# Patient Record
Sex: Female | Born: 1935 | Race: White | Hispanic: No | Smoking: Never smoker
Health system: Southern US, Community
[De-identification: ages and names within clinical notes are randomized; demographics above are authoritative.]

## PROBLEM LIST (undated history)

## (undated) DIAGNOSIS — E079 Disorder of thyroid, unspecified: Secondary | ICD-10-CM

## (undated) DIAGNOSIS — I10 Essential (primary) hypertension: Secondary | ICD-10-CM

## (undated) DIAGNOSIS — I4891 Unspecified atrial fibrillation: Secondary | ICD-10-CM

## (undated) DIAGNOSIS — R251 Tremor, unspecified: Secondary | ICD-10-CM

## (undated) DIAGNOSIS — C801 Malignant (primary) neoplasm, unspecified: Secondary | ICD-10-CM

## (undated) HISTORY — PX: CARDIAC ELECTROPHYSIOLOGY STUDY AND ABLATION: SHX1294

## (undated) HISTORY — PX: BREAST CYST ASPIRATION: SHX578

## (undated) HISTORY — PX: PACEMAKER INSERTION: SHX728

## (undated) HISTORY — PX: ABDOMINAL HYSTERECTOMY: SHX81

## (undated) HISTORY — PX: TONSILLECTOMY: SUR1361

---

## 2006-08-12 ENCOUNTER — Ambulatory Visit: Payer: Self-pay | Admitting: Unknown Physician Specialty

## 2006-08-14 ENCOUNTER — Ambulatory Visit: Payer: Self-pay | Admitting: Unknown Physician Specialty

## 2007-03-03 ENCOUNTER — Ambulatory Visit: Payer: Self-pay | Admitting: Unknown Physician Specialty

## 2007-08-17 ENCOUNTER — Ambulatory Visit: Payer: Self-pay | Admitting: Unknown Physician Specialty

## 2008-07-27 ENCOUNTER — Inpatient Hospital Stay: Payer: Self-pay | Admitting: Internal Medicine

## 2008-12-12 ENCOUNTER — Ambulatory Visit: Payer: Self-pay | Admitting: Unknown Physician Specialty

## 2009-02-22 ENCOUNTER — Ambulatory Visit: Payer: Self-pay | Admitting: Unknown Physician Specialty

## 2009-02-26 ENCOUNTER — Ambulatory Visit: Payer: Self-pay | Admitting: Internal Medicine

## 2010-03-08 ENCOUNTER — Ambulatory Visit: Payer: Self-pay | Admitting: Unknown Physician Specialty

## 2011-05-07 ENCOUNTER — Ambulatory Visit: Payer: Self-pay | Admitting: Unknown Physician Specialty

## 2011-05-17 ENCOUNTER — Ambulatory Visit: Payer: Self-pay

## 2012-03-26 ENCOUNTER — Ambulatory Visit: Payer: Self-pay | Admitting: Medical

## 2013-01-13 ENCOUNTER — Ambulatory Visit: Payer: Self-pay | Admitting: Unknown Physician Specialty

## 2013-04-11 ENCOUNTER — Emergency Department: Payer: Self-pay | Admitting: Emergency Medicine

## 2014-05-10 ENCOUNTER — Ambulatory Visit: Payer: Self-pay | Admitting: Unknown Physician Specialty

## 2014-12-22 ENCOUNTER — Encounter: Payer: Self-pay | Admitting: Emergency Medicine

## 2014-12-22 ENCOUNTER — Emergency Department
Admission: EM | Admit: 2014-12-22 | Discharge: 2014-12-22 | Disposition: A | Discharge status: Home / Self Care | Payer: Medicare Other | Attending: Emergency Medicine | Admitting: Emergency Medicine

## 2014-12-22 ENCOUNTER — Emergency Department: Payer: Medicare Other

## 2014-12-22 DIAGNOSIS — Z880 Allergy status to penicillin: Secondary | ICD-10-CM | POA: Insufficient documentation

## 2014-12-22 DIAGNOSIS — Y998 Other external cause status: Secondary | ICD-10-CM | POA: Diagnosis not present

## 2014-12-22 DIAGNOSIS — Y9289 Other specified places as the place of occurrence of the external cause: Secondary | ICD-10-CM | POA: Insufficient documentation

## 2014-12-22 DIAGNOSIS — S3992XA Unspecified injury of lower back, initial encounter: Secondary | ICD-10-CM | POA: Diagnosis not present

## 2014-12-22 DIAGNOSIS — Y9389 Activity, other specified: Secondary | ICD-10-CM | POA: Insufficient documentation

## 2014-12-22 DIAGNOSIS — S93402A Sprain of unspecified ligament of left ankle, initial encounter: Secondary | ICD-10-CM | POA: Diagnosis not present

## 2014-12-22 DIAGNOSIS — S92252A Displaced fracture of navicular [scaphoid] of left foot, initial encounter for closed fracture: Secondary | ICD-10-CM | POA: Diagnosis not present

## 2014-12-22 DIAGNOSIS — S99912A Unspecified injury of left ankle, initial encounter: Secondary | ICD-10-CM | POA: Diagnosis present

## 2014-12-22 HISTORY — DX: Unspecified atrial fibrillation: I48.91

## 2014-12-22 HISTORY — DX: Tremor, unspecified: R25.1

## 2014-12-22 MED ORDER — TRAMADOL HCL 50 MG PO TABS
50.0000 mg | ORAL_TABLET | Freq: Once | ORAL | Status: AC
  Administered 2014-12-22: 50 mg via ORAL

## 2014-12-22 MED ORDER — TRAMADOL HCL 50 MG PO TABS: 50.0000 mg | ORAL_TABLET | Freq: Four times a day (QID) | ORAL | Status: DC | PRN

## 2014-12-22 MED ORDER — TRAMADOL HCL 50 MG PO TABS
ORAL_TABLET | ORAL | Status: AC
  Filled 2014-12-22: qty 1

## 2014-12-22 NOTE — ED Provider Notes (Signed)
[facility] Emergency Department Provider Note ____________________________________________  Time seen: Approximately 11:23 PM  I have reviewed the triage vital signs and the nursing notes.   HISTORY  Chief Complaint Ankle Pain   HPI Megan Rodgers is a 81 y.o. female who presents to the emergency department with complaint of left foot, ankle, and lower back pain. She states she stepped out of her truck and rolled her ankle and "fell straight down on her (my) butt." She denies loss of consciousness or neck pain.   Past Medical History  Diagnosis Date  . Atrial fibrillation   . Tremors of nervous system     There are no active problems to display for this patient.   Past Surgical History  Procedure Laterality Date  . Pacemaker insertion    . Cardiac electrophysiology study and ablation    . Abdominal hysterectomy    . Tonsillectomy      Current Outpatient Rx  Name  Route  Sig  Dispense  Refill  . traMADol (ULTRAM) 50 MG tablet   Oral   Take 1 tablet (50 mg total) by mouth every 6 (six) hours as needed.   9 tablet   0     Allergies Penicillins  No family history on file.  Social History History  Substance Use Topics  . Smoking status: Never Smoker   . Smokeless tobacco: Not on file  . Alcohol Use: No    Review of Systems Constitutional: No recent illness. Eyes: No visual changes. ENT: No sore throat. Cardiovascular: Denies chest pain or palpitations. Respiratory: Denies shortness of breath. Gastrointestinal: No abdominal pain.  Genitourinary: Negative for dysuria. Musculoskeletal: Pain in left ankle, foot, and lumbar area. Skin: Negative for rash. Neurological: Negative for headaches, focal weakness or numbness. 10-point ROS otherwise negative.  ____________________________________________   PHYSICAL EXAM:  VITAL SIGNS: ED Triage Vitals  Enc Vitals Group     BP 12/22/14 1919 152/61 mmHg     Pulse Rate 12/22/14 1919  76     Resp 12/22/14 1919 18     Temp 12/22/14 1919 98.8 F (37.1 C)     Temp Source 12/22/14 1919 Oral     SpO2 12/22/14 1919 100 %     Weight 12/22/14 1919 136 lb (61.689 kg)     Height 12/22/14 1919 5\' 5"  (1.651 m)     Head Cir --      Peak Flow --      Pain Score 12/22/14 1927 7     Pain Loc --      Pain Edu? --      Excl. in [place]? --     Constitutional: Alert and oriented. Well appearing and in no acute distress. Eyes: Conjunctivae are normal. EOMI. Head: Atraumatic. Nose: No congestion/rhinnorhea. Neck: No stridor.  Respiratory: Normal respiratory effort.   Musculoskeletal: Edema and tenderness to the ankle and dorsal aspect of the left foot. No midline tenderness of the lumbar spine.  Neurologic:  Normal speech and language. No gross focal neurologic deficits are appreciated. Speech is normal. No gait instability. Skin:  Skin is warm, dry and intact. Atraumatic. Psychiatric: Mood and affect are normal. Speech and behavior are normal.  ____________________________________________   LABS (all labs ordered are listed, but only abnormal results are displayed)  Labs Reviewed - No data to display ____________________________________________  RADIOLOGY  Old compression fracture of the L1. Tarsal navicular bone avulsion left. ____________________________________________   PROCEDURES  Procedure(s) performed: Ace bandage applied to the left  foot and ankle. Post Op shoe applied as well. Neurovascularly intact post application.    ____________________________________________   INITIAL IMPRESSION / ASSESSMENT AND PLAN / ED COURSE  Pertinent labs & imaging results that were available during my care of the patient were reviewed by me and considered in my medical decision making (see chart for details).  Patient to use the walker to assist with ambulation. She is to call and schedule an appointment with the orthopedic doctor.  She was advised to return to the ER for  symptoms that change or worsen if she is unable to schedule an appointment. ____________________________________________   FINAL CLINICAL IMPRESSION(S) / ED DIAGNOSES  Final diagnoses:  Navicular fracture of ankle, left, closed, initial encounter  Ankle sprain, left, initial encounter      Victorino Dike, FNP 12/22/14 0413  Hinda Kehr, MD 12/23/14 2108

## 2014-12-22 NOTE — ED Notes (Signed)
NP CBT at bedside at this time

## 2014-12-22 NOTE — Discharge Instructions (Signed)

## 2014-12-22 NOTE — ED Notes (Signed)
Pt discharged home after verbalizing understanding of discharge instructions; nad noted. 

## 2014-12-22 NOTE — ED Notes (Signed)
Patient missed a step, rolled her ankle and landed on her rear. C/o pain to her left ankle. Edema to left ankle noted.

## 2015-03-11 ENCOUNTER — Ambulatory Visit: Payer: Medicare Other

## 2015-03-11 ENCOUNTER — Ambulatory Visit
Admission: EM | Admit: 2015-03-11 | Discharge: 2015-03-11 | Disposition: A | Discharge status: Home / Self Care | Payer: Medicare Other | Attending: Family Medicine | Admitting: Family Medicine

## 2015-03-11 ENCOUNTER — Encounter: Payer: Self-pay | Admitting: *Deleted

## 2015-03-11 DIAGNOSIS — S32019A Unspecified fracture of first lumbar vertebra, initial encounter for closed fracture: Secondary | ICD-10-CM | POA: Insufficient documentation

## 2015-03-11 DIAGNOSIS — I4891 Unspecified atrial fibrillation: Secondary | ICD-10-CM | POA: Insufficient documentation

## 2015-03-11 DIAGNOSIS — E079 Disorder of thyroid, unspecified: Secondary | ICD-10-CM | POA: Insufficient documentation

## 2015-03-11 DIAGNOSIS — S32010A Wedge compression fracture of first lumbar vertebra, initial encounter for closed fracture: Secondary | ICD-10-CM | POA: Diagnosis not present

## 2015-03-11 DIAGNOSIS — W19XXXA Unspecified fall, initial encounter: Secondary | ICD-10-CM | POA: Diagnosis not present

## 2015-03-11 DIAGNOSIS — S51812A Laceration without foreign body of left forearm, initial encounter: Secondary | ICD-10-CM | POA: Insufficient documentation

## 2015-03-11 DIAGNOSIS — Z79899 Other long term (current) drug therapy: Secondary | ICD-10-CM | POA: Insufficient documentation

## 2015-03-11 HISTORY — DX: Disorder of thyroid, unspecified: E07.9

## 2015-03-11 NOTE — Discharge Instructions (Signed)
Back, Compression Fracture °A compression fracture happens when a force is put upon the length of your spine. Slipping and falling on your bottom are examples of such a force. When this happens, sometimes the force is great enough to compress the building blocks (vertebral bodies) of your spine. Although this causes a lot of pain, this can usually be treated at home, unless your caregiver feels hospitalization is needed for pain control. °Your backbone (spinal column) is made up of 24 main vertebral bodies in addition to the sacrum and coccyx (see illustration). These are held together by tough fibrous tissues (ligaments) and by support of your muscles. Nerve roots pass through the openings between the vertebrae. A sudden wrenching move, injury, or a fall may cause a compression fracture of one of the vertebral bodies. This may result in back pain or spread of pain into the belly (abdomen), the buttocks, and down the leg into the foot. Pain may also be created by muscle spasm alone. °Large studies have been undertaken to determine the best possible course of action to help your back following injury and also to prevent future problems. The recommendations are as follows. °FOLLOWING A COMPRESSION FRACTURE: °Do the following only if advised by your caregiver.  °· If a back brace has been suggested or provided, wear it as directed. °· Do not stop wearing the back brace unless instructed by your caregiver. °· When allowed to return to regular activities, avoid a sedentary lifestyle. Actively exercise. Sporadic weekend binges of tennis, racquetball, or waterskiing may actually aggravate or create problems, especially if you are not in condition for that activity. °· Avoid sports requiring sudden body movements until you are in condition for them. Swimming and walking are safer activities. °· Maintain good posture. °· Avoid obesity. °· If not already done, you should have a DEXA scan. Based on the results, be treated for  osteoporosis. °FOLLOWING ACUTE (SUDDEN) INJURY: °· Only take over-the-counter or prescription medicines for pain, discomfort, or fever as directed by your caregiver. °· Use bed rest for only the most extreme acute episode. Prolonged bed rest may aggravate your condition. Ice used for acute conditions is effective. Use a large plastic bag filled with ice. Wrap it in a towel. This also provides excellent pain relief. This may be continuous. Or use it for 30 minutes every 2 hours during acute phase, then as needed. Heat for 30 minutes prior to activities is helpful. °· As soon as the acute phase (the time when your back is too painful for you to do normal activities) is over, it is important to resume normal activities and work hardening programs. Back injuries can cause potentially marked changes in lifestyle. So it is important to attack these problems aggressively. °· See your caregiver for continued problems. He or she can help or refer you for appropriate exercises, physical therapy, and work hardening if needed. °· If you are given narcotic medications for your condition, for the next 24 hours do not: °¨ Drive. °¨ Operate machinery or power tools. °¨ Sign legal documents. °· Do not drink alcohol, or take sleeping pills or other medications that may interfere with treatment. °If your caregiver has given you a follow-up appointment, it is very important to keep that appointment. Not keeping the appointment could result in a chronic or permanent injury, pain, and disability. If there is any problem keeping the appointment, you must call back to this facility for assistance.  °SEEK IMMEDIATE MEDICAL CARE IF: °· You develop numbness,   tingling, weakness, or problems with the use of your arms or legs. °· You develop severe back pain not relieved with medications. °· You have changes in bowel or bladder control. °· You have increasing pain in any areas of the body. °Document Released: 06/10/2005 Document Revised:  10/25/2013 Document Reviewed: 01/13/2008 °Exit[employer]® Patient Information ©2015 [employer]. This information is not intended to replace advice given to you by your health care provider. Make sure you discuss any questions you have with your health care provider. ° °

## 2015-03-11 NOTE — ED Provider Notes (Signed)
CSN: [id number]     Arrival date & time 03/11/15  1031 History   First MD Initiated Contact with Patient 03/11/15 1144     Chief Complaint  Patient presents with  . Fall   (Consider location/radiation/quality/duration/timing/severity/associated sxs/prior Treatment) HPI  This a 81 year old female who presents after falling at about 3 AM this morning. She states that she had increased her dose of primidone twice at nighttime cause of sleepiness she was feeling during the daytime. She states that she awoke last night to go the bathroom and after finishing urinating walk back into her room a very short distance, was in the process of reaching up to turn off her fan,she evidently fell hard on her left side and being out momentarily but she is uncertain as this was unwitnessed and nobody came to her aid. She found that she had a laceration on her left dorsal forearm that had bled while she was in bed. Doesn't think that she hit her head. Most her pain is in her lef tback and her left arm. Past Medical History  Diagnosis Date  . Atrial fibrillation   . Tremors of nervous system   . Thyroid disease    Past Surgical History  Procedure Laterality Date  . Pacemaker insertion    . Cardiac electrophysiology study and ablation    . Abdominal hysterectomy    . Tonsillectomy     No family history on file. Social History  Substance Use Topics  . Smoking status: Never Smoker   . Smokeless tobacco: None  . Alcohol Use: No   OB History    No data available     Review of Systems  Cardiovascular: Negative for chest pain, palpitations and leg swelling.  Musculoskeletal: Positive for back pain.  Skin: Positive for wound.  All other systems reviewed and are negative.   Allergies  Penicillins; Codeine; Iodinated diagnostic agents; Metronidazole; Shellfish-derived products; and Tetracyclines & related  Home Medications   Prior to Admission medications   Medication Sig Start Date End Date Taking?  Authorizing Provider  Cholecalciferol (VITAMIN D-3 PO) Take by mouth.   Yes Historical Provider, MD  citalopram (CELEXA) 10 MG tablet Take 10 mg by mouth daily.   Yes Historical Provider, MD  dabigatran (PRADAXA) 150 MG CAPS capsule Take 150 mg by mouth 2 (two) times daily.   Yes Historical Provider, MD  estradiol (ESTRACE) 0.1 MG/GM vaginal cream Place 1 Applicatorful vaginally at bedtime.   Yes Historical Provider, MD  fluticasone (FLONASE) 50 MCG/ACT nasal spray Place into both nostrils daily.   Yes Historical Provider, MD  levothyroxine (SYNTHROID, LEVOTHROID) 50 MCG tablet Take 50 mcg by mouth daily before breakfast.   Yes Historical Provider, MD  Multiple Vitamin (MULTIVITAMIN) capsule Take 1 capsule by mouth daily.   Yes Historical Provider, MD  primidone (MYSOLINE) 50 MG tablet Take 50 mg by mouth 3 (three) times daily.   Yes Historical Provider, MD  propranolol (INDERAL) 60 MG tablet Take 60 mg by mouth daily.   Yes Historical Provider, MD  traMADol (ULTRAM) 50 MG tablet Take 1 tablet (50 mg total) by mouth every 6 (six) hours as needed. 12/22/14   Victorino Dike, FNP   Meds Ordered and Administered this Visit  Medications - No data to display  BP 126/59 mmHg  Pulse 74  Temp(Src) 98.3 F (36.8 C) (Oral)  Ht 5\' 5"  (1.651 m)  Wt 134 lb (60.782 kg)  BMI 22.30 kg/m2  SpO2 100% No data found.  Physical Exam  Constitutional: She is oriented to person, place, and time. She appears well-developed and well-nourished. No distress.  HENT:  Head: Normocephalic and atraumatic.  Eyes: EOM are normal. Pupils are equal, round, and reactive to light.  Neck: Normal range of motion. Neck supple.  Pulmonary/Chest: Effort normal and breath sounds normal. No stridor. No respiratory distress. She has no wheezes. She has no rales.  Abdominal: Soft. Bowel sounds are normal. She exhibits no distension. There is no tenderness. There is no rebound and no guarding.  Musculoskeletal: Normal range of  motion.  Examination the spine was carried out with Gwinda Passe RN acting as chaperone. The spine shows no deformities. She is able to stand with level pelvis. For flexion causes slight back pain lower thoracic upper lumbar and maximum tenderness is along the paraspinous muscles at that level almost entirely on the left. There does not appear to be any tenderness in the paraspinous muscles on the right. Thoracic rotation is most uncomfortable again to the left at the thoracolumbar junction.  Lymphadenopathy:    She has no cervical adenopathy.  Neurological: She is alert and oriented to person, place, and time. She displays normal reflexes. No cranial nerve deficit. She exhibits normal muscle tone. Coordination normal.  Skin: Skin is warm and dry. She is not diaphoretic.  Psychiatric: She has a normal mood and affect. Her behavior is normal. Judgment and thought content normal.  Nursing note and vitals reviewed.   ED Course  Procedures (including critical care time)  Labs Review Labs Reviewed - No data to display  Imaging Review Dg Chest 2 View  03/11/2015   CLINICAL DATA:  Left paraspinal pain and muscle spasms, status post fall.  EXAM: CHEST  2 VIEW, THORACIC SPINE TWO VIEW  COMPARISON:  Chest radiograph 05/17/2011  FINDINGS: Dual lead cardiac pacemaker is stable.  Cardiomediastinal silhouette is normal. Mediastinal contours appear intact. The aorta is torturous.  There is no evidence of focal airspace consolidation, pleural effusion or pneumothorax. There stable hyperinflation of the lungs. There is a small left pleural effusion.  Osseous structures are without acute abnormality. There is a stable compression deformity of the superior endplate of L2 vertebral body, with approximately 40% height loss, stable from the previous chest radiograph dated November 2012.  There is dextro convex thoraco lumbar spine scoliosis centered at T12. Mild compression deformity of T12 and L1 vertebral bodies is likely  degenerative. There are multilevel osteoarthritic changes of the thoracic spine.  Soft tissues are grossly normal.  IMPRESSION: No radiographic evidence of acute cardiopulmonary abnormality.  Stable hyperinflation of the lungs likely related to obstructive disease.  Small left pleural effusion.  L2 vertebral body compression deformity, new from the prior radiograph dated 2012. Please correlate to site of tenderness, as acute fracture cannot be excluded.  Osteoarthritic changes of the thoracolumbar spine and stable mild compression deformities of T12 and L1 vertebral bodies.   Electronically Signed   By: Fidela Salisbury M.D.   On: 03/11/2015 13:24   Dg Thoracic Spine 2 View  03/11/2015   CLINICAL DATA:  Left paraspinal pain and muscle spasms, status post fall.  EXAM: CHEST  2 VIEW, THORACIC SPINE TWO VIEW  COMPARISON:  Chest radiograph 05/17/2011  FINDINGS: Dual lead cardiac pacemaker is stable.  Cardiomediastinal silhouette is normal. Mediastinal contours appear intact. The aorta is torturous.  There is no evidence of focal airspace consolidation, pleural effusion or pneumothorax. There stable hyperinflation of the lungs. There is a small left pleural effusion.  Osseous structures are without acute abnormality. There is a stable compression deformity of the superior endplate of L2 vertebral body, with approximately 40% height loss, stable from the previous chest radiograph dated November 2012.  There is dextro convex thoraco lumbar spine scoliosis centered at T12. Mild compression deformity of T12 and L1 vertebral bodies is likely degenerative. There are multilevel osteoarthritic changes of the thoracic spine.  Soft tissues are grossly normal.  IMPRESSION: No radiographic evidence of acute cardiopulmonary abnormality.  Stable hyperinflation of the lungs likely related to obstructive disease.  Small left pleural effusion.  L2 vertebral body compression deformity, new from the prior radiograph dated 2012.  Please correlate to site of tenderness, as acute fracture cannot be excluded.  Osteoarthritic changes of the thoracolumbar spine and stable mild compression deformities of T12 and L1 vertebral bodies.   Electronically Signed   By: Fidela Salisbury M.D.   On: 03/11/2015 13:24     Visual Acuity Review  Right Eye Distance:   Left Eye Distance:   Bilateral Distance:    Right Eye Near:   Left Eye Near:    Bilateral Near:         MDM   1. Compression fracture of L1 lumbar vertebra, closed, initial encounter   2. Skin tear of forearm without complication, left, initial encounter    New Prescriptions   No medications on file  Plan: 1. Test/x-ray results and diagnosis reviewed with patient 2. rx as per orders; risks, benefits, potential side effects reviewed with patient 3. Recommend supportive treatment with ace wrap to left forearm.  Avoid bending at waist. 4. F/u PCP next week in 2 days. To ED if confusion ,increased pain constipation etc. Use Tylenol for pain.     Lorin Picket, PA-C 03/11/15 1409

## 2015-03-11 NOTE — ED Notes (Signed)
Pt states that she had a fall about 3am today, reached up to turn light switch on and the next thing she knew she was on the floor.  Pt states that she was not dizzy.  In the fall she now has mid-back pain and a laceration on left arm.

## 2015-03-14 ENCOUNTER — Ambulatory Visit: Payer: Medicare Other

## 2015-03-14 ENCOUNTER — Ambulatory Visit
Admission: EM | Admit: 2015-03-14 | Discharge: 2015-03-14 | Disposition: A | Discharge status: Home / Self Care | Payer: Medicare Other | Attending: Family Medicine | Admitting: Family Medicine

## 2015-03-14 DIAGNOSIS — X58XXXS Exposure to other specified factors, sequela: Secondary | ICD-10-CM | POA: Diagnosis not present

## 2015-03-14 DIAGNOSIS — I4891 Unspecified atrial fibrillation: Secondary | ICD-10-CM | POA: Insufficient documentation

## 2015-03-14 DIAGNOSIS — E079 Disorder of thyroid, unspecified: Secondary | ICD-10-CM | POA: Diagnosis not present

## 2015-03-14 DIAGNOSIS — S29019A Strain of muscle and tendon of unspecified wall of thorax, initial encounter: Secondary | ICD-10-CM

## 2015-03-14 DIAGNOSIS — M4856XG Collapsed vertebra, not elsewhere classified, lumbar region, subsequent encounter for fracture with delayed healing: Secondary | ICD-10-CM | POA: Insufficient documentation

## 2015-03-14 DIAGNOSIS — S29009A Unspecified injury of muscle and tendon of unspecified wall of thorax, initial encounter: Secondary | ICD-10-CM | POA: Diagnosis not present

## 2015-03-14 DIAGNOSIS — S51802D Unspecified open wound of left forearm, subsequent encounter: Secondary | ICD-10-CM

## 2015-03-14 DIAGNOSIS — Z79899 Other long term (current) drug therapy: Secondary | ICD-10-CM | POA: Diagnosis not present

## 2015-03-14 DIAGNOSIS — S32020S Wedge compression fracture of second lumbar vertebra, sequela: Secondary | ICD-10-CM | POA: Diagnosis not present

## 2015-03-14 DIAGNOSIS — S51812D Laceration without foreign body of left forearm, subsequent encounter: Secondary | ICD-10-CM

## 2015-03-14 NOTE — ED Provider Notes (Signed)
CSN: [id number]     Arrival date & time 03/14/15  1126 History   First MD Initiated Contact with Patient 03/14/15 1302     Chief Complaint  Patient presents with  . Follow-up   (Consider location/radiation/quality/duration/timing/severity/associated sxs/prior Treatment) HPI   This is a 81 year old female who was seen by the undersigned last week after she fell from an apparent syncopal episode caused by medication changes that she had changed recently. At that time she had sustained a compression fracture of the L2 vertebral body ,skin tear on her dorsum left arm was not complaining of shoulder pain at that time. I asked her to follow-up with her primary care physician this week but she states her son is having this back problems  and was unable to take her and she [place] afraid [place] , so she returned here. Her major complaint today is scapular pain. She is not having any cervical pain and has full range of motion. She perceives her pain slightly under her arm as well. Denies any shortness of breath or productive cough.    Past Medical History  Diagnosis Date  . Atrial fibrillation   . Tremors of nervous system   . Thyroid disease    Past Surgical History  Procedure Laterality Date  . Pacemaker insertion    . Cardiac electrophysiology study and ablation    . Abdominal hysterectomy    . Tonsillectomy     No family history on file. Social History  Substance Use Topics  . Smoking status: Never Smoker   . Smokeless tobacco: None  . Alcohol Use: No   OB History    No data available     Review of Systems  Constitutional: Positive for activity change. Negative for fever, chills and fatigue.  Musculoskeletal: Positive for myalgias.  All other systems reviewed and are negative.   Allergies  Penicillins; Codeine; Iodinated diagnostic agents; Metronidazole; Shellfish-derived products; and Tetracyclines & related  Home Medications   Prior to Admission medications    Medication Sig Start Date End Date Taking? Authorizing Provider  Cholecalciferol (VITAMIN D-3 PO) Take by mouth.    Historical Provider, MD  citalopram (CELEXA) 10 MG tablet Take 10 mg by mouth daily.    Historical Provider, MD  dabigatran (PRADAXA) 150 MG CAPS capsule Take 150 mg by mouth 2 (two) times daily.    Historical Provider, MD  estradiol (ESTRACE) 0.1 MG/GM vaginal cream Place 1 Applicatorful vaginally at bedtime.    Historical Provider, MD  fluticasone (FLONASE) 50 MCG/ACT nasal spray Place into both nostrils daily.    Historical Provider, MD  levothyroxine (SYNTHROID, LEVOTHROID) 50 MCG tablet Take 50 mcg by mouth daily before breakfast.    Historical Provider, MD  Multiple Vitamin (MULTIVITAMIN) capsule Take 1 capsule by mouth daily.    Historical Provider, MD  primidone (MYSOLINE) 50 MG tablet Take 50 mg by mouth 3 (three) times daily.    Historical Provider, MD  propranolol (INDERAL) 60 MG tablet Take 60 mg by mouth daily.    Historical Provider, MD  traMADol (ULTRAM) 50 MG tablet Take 1 tablet (50 mg total) by mouth every 6 (six) hours as needed. 12/22/14   Victorino Dike, FNP   Meds Ordered and Administered this Visit  Medications - No data to display  BP 132/67 mmHg  Pulse 70  Temp(Src) 97.4 F (36.3 C) (Tympanic)  Resp 16  Ht 5\' 5"  (1.651 m)  Wt 134 lb (60.782 kg)  BMI 22.30  kg/m2  SpO2 99% No data found.   Physical Exam  Constitutional: She is oriented to person, place, and time. She appears well-developed and well-nourished. No distress.  HENT:  Head: Normocephalic and atraumatic.  Eyes: Pupils are equal, round, and reactive to light.  Neck: Normal range of motion. Neck supple.  CervicalSpine range of motion shows full flexion and extension and is equal bilaterally without pain. Does have tenderness to palpation along the medial scapular border at approximately the T3-4 level, reproducing her pain. Shoulder range of motion passively and actively is full and  comfortable. Is no tenderness over the acromion or along the humerus. He has no radius or ulnar tenderness;range of motion of the elbow and wrist are full.  Musculoskeletal: Normal range of motion.  Neurological: She is alert and oriented to person, place, and time.  Skin: Skin is warm and dry. She is not diaphoretic.  Psychiatric: She has a normal mood and affect. Her behavior is normal. Judgment and thought content normal.  Nursing note and vitals reviewed.   ED Course  Procedures (including critical care time)  Labs Review Labs Reviewed - No data to display  Imaging Review Dg Shoulder Left  03/14/2015   CLINICAL DATA:  81 year old female with left arm and shoulder pain after falling this past Saturday morning  EXAM: LEFT SHOULDER - 2+ VIEW  COMPARISON:  Prior chest x-ray 03/11/2015  FINDINGS: There is no evidence of fracture or dislocation. There is no evidence of arthropathy or other focal bone abnormality. Soft tissues are unremarkable. Left subclavian approach cardiac rhythm maintenance device. Atherosclerotic calcifications present in the transverse aorta.  IMPRESSION: Negative.   Electronically Signed   By: Jacqulynn Cadet M.D.   On: 03/14/2015 13:52     Visual Acuity Review  Right Eye Distance:   Left Eye Distance:   Bilateral Distance:    Right Eye Near:   Left Eye Near:    Bilateral Near:         MDM   1. Compression fracture of L2, sequela   2. Skin tear of left forearm without complication, subsequent encounter   3. Thoracic myofascial strain, initial encounter    Plan: 1. Test/x-ray results and diagnosis reviewed with patient 2. rx as per orders; risks, benefits, potential side effects reviewed with patient 3. Recommend supportive treatment with tylenol,rest.  4. F/u PCP next week   Lorin Picket, PA-C 03/14/15 1428

## 2015-03-14 NOTE — ED Notes (Signed)
Pt states "I am still having pain under my left arm. It is hard for me to get to [facility] to my Primary Care."

## 2016-02-05 ENCOUNTER — Other Ambulatory Visit: Payer: Self-pay | Admitting: Unknown Physician Specialty

## 2016-02-05 DIAGNOSIS — Z1231 Encounter for screening mammogram for malignant neoplasm of breast: Secondary | ICD-10-CM

## 2016-02-07 ENCOUNTER — Ambulatory Visit
Admission: RE | Admit: 2016-02-07 | Discharge: 2016-02-07 | Disposition: A | Discharge status: Home / Self Care | Payer: Medicare Other | Source: Ambulatory Visit | Attending: Unknown Physician Specialty | Admitting: Unknown Physician Specialty

## 2016-02-07 ENCOUNTER — Other Ambulatory Visit: Payer: Self-pay | Admitting: Unknown Physician Specialty

## 2016-02-07 DIAGNOSIS — Z1231 Encounter for screening mammogram for malignant neoplasm of breast: Secondary | ICD-10-CM

## 2016-02-07 HISTORY — DX: Malignant (primary) neoplasm, unspecified: C80.1

## 2017-08-20 ENCOUNTER — Ambulatory Visit (INDEPENDENT_AMBULATORY_CARE_PROVIDER_SITE_OTHER): Payer: Medicare Other | Admitting: Vascular Surgery

## 2017-08-20 ENCOUNTER — Encounter (INDEPENDENT_AMBULATORY_CARE_PROVIDER_SITE_OTHER): Payer: Self-pay | Admitting: Vascular Surgery

## 2017-08-20 ENCOUNTER — Ambulatory Visit (INDEPENDENT_AMBULATORY_CARE_PROVIDER_SITE_OTHER): Payer: Medicare Other

## 2017-08-20 ENCOUNTER — Other Ambulatory Visit (INDEPENDENT_AMBULATORY_CARE_PROVIDER_SITE_OTHER): Payer: Self-pay | Admitting: Vascular Surgery

## 2017-08-20 DIAGNOSIS — I10 Essential (primary) hypertension: Secondary | ICD-10-CM | POA: Diagnosis not present

## 2017-08-20 DIAGNOSIS — I739 Peripheral vascular disease, unspecified: Secondary | ICD-10-CM

## 2017-08-20 DIAGNOSIS — M25579 Pain in unspecified ankle and joints of unspecified foot: Secondary | ICD-10-CM

## 2017-08-20 NOTE — Progress Notes (Signed)
Subjective:    Patient ID: Megan Rodgers, female    DOB: 1929/05/04, 84 y.o.   MRN: [id number] No chief complaint on file.  Presents as a new patient referred by Dr. Vickki Muff for evaluation of bilateral foot pain.  The patient was seen with her daughter.  The patient reports experiencing bilateral foot pain which is now radiating up her legs.  This has been progressively worsening over the last "few months".  The patient states she does experience cramping to the bilateral calves on occasion.  The patient denies any rest pain or ulcerations extremity.  She denies any recent surgery or trauma to the bilateral lower extremity.  The patient denies any erythema to the bilateral lower extremity.  The patient underwent a bilateral ABI which was notable for resting ankle-brachial index ease within normal range bilaterally.  No evidence of significant bilateral lower extremity arterial disease.  There is no previous ABI for comparison.  The patient denies any fever, nausea or vomiting.   Review of Systems  Constitutional: Negative.   HENT: Negative.   Eyes: Negative.   Respiratory: Negative.   Cardiovascular:       Bilateral foot pain  Gastrointestinal: Negative.   Endocrine: Negative.   Genitourinary: Negative.   Musculoskeletal: Negative.   Skin: Negative.   Allergic/Immunologic: Negative.   Neurological: Negative.   Hematological: Negative.   Psychiatric/Behavioral: Negative.       Objective:   Physical Exam  Constitutional: She is oriented to person, place, and time. She appears well-developed and well-nourished. No distress.  HENT:  Head: Normocephalic and atraumatic.  Eyes: Conjunctivae are normal. Pupils are equal, round, and reactive to light.  Neck: Normal range of motion.  Cardiovascular: Normal rate, regular rhythm, normal heart sounds and intact distal pulses.  Pulses:      Radial pulses are 2+ on the right side, and 2+ on the left side.       Dorsalis pedis pulses are 1+  on the right side, and 1+ on the left side.       Posterior tibial pulses are 1+ on the right side, and 1+ on the left side.  Pulmonary/Chest: Effort normal and breath sounds normal.  Musculoskeletal: Normal range of motion. She exhibits no edema.  Neurological: She is alert and oriented to person, place, and time.  Skin: Skin is warm and dry. She is not diaphoretic.  Psychiatric: She has a normal mood and affect. Her behavior is normal. Judgment and thought content normal.  Vitals reviewed.  There were no vitals taken for this visit.  Past Medical History:  Diagnosis Date  . Atrial fibrillation ([place])   . Cancer ([place])    skin ca  . Thyroid disease   . Tremors of nervous system    Social History   Socioeconomic History  . Marital status: Widowed    Spouse name: Not on file  . Number of children: Not on file  . Years of education: Not on file  . Highest education level: Not on file  Social Needs  . Financial resource strain: Not on file  . Food insecurity - worry: Not on file  . Food insecurity - inability: Not on file  . Transportation needs - medical: Not on file  . Transportation needs - non-medical: Not on file  Occupational History  . Not on file  Tobacco Use  . Smoking status: Never Smoker  . Smokeless tobacco: Never Used  Substance and Sexual Activity  . Alcohol use: No  .  Drug use: No  . Sexual activity: Not on file  Other Topics Concern  . Not on file  Social History Narrative  . Not on file   Past Surgical History:  Procedure Laterality Date  . ABDOMINAL HYSTERECTOMY    . BREAST CYST ASPIRATION Left    lt fna-neg  . CARDIAC ELECTROPHYSIOLOGY STUDY AND ABLATION    . PACEMAKER INSERTION    . TONSILLECTOMY     Family History  Problem Relation Age of Onset  . Breast cancer Neg Hx    Allergies  Allergen Reactions  . Penicillins Anaphylaxis  . Codeine   . Iodinated Diagnostic Agents   . Metronidazole   . Shellfish-Derived Products   . Tetracyclines  & Related       Assessment & Plan:  Presents as a new patient referred by Dr. Vickki Muff for evaluation of bilateral foot pain.  The patient was seen with her daughter.  The patient reports experiencing bilateral foot pain which is now radiating up her legs.  This has been progressively worsening over the last "few months".  The patient states she does experience cramping to the bilateral calves on occasion.  The patient denies any rest pain or ulcerations extremity.  She denies any recent surgery or trauma to the bilateral lower extremity.  The patient denies any erythema to the bilateral lower extremity.  The patient underwent a bilateral ABI which was notable for resting ankle-brachial index ease within normal range bilaterally.  No evidence of significant bilateral lower extremity arterial disease.  There is no previous ABI for comparison.  The patient denies any fever, nausea or vomiting.  1. Pain in joint involving ankle and foot, unspecified laterality - New Patient presents with chronic bilateral foot pain Physical exam is unremarkable.  Patient with faint pedal pulses however this is most likely anatomical in nature Patient underwent an ABI today which was normal with no evidence of lower extremity arterial occlusive disease bilaterally Patient's discomfort most likely neuropathic or osteoarthritic in nature. Patient to follow-up as needed  2. Essential hypertension - Stable Encouraged good control as its slows the progression of atherosclerotic disease  Current Outpatient Medications on File Prior to Visit  Medication Sig Dispense Refill  . Cholecalciferol (VITAMIN D-3 PO) Take by mouth.    . citalopram (CELEXA) 10 MG tablet Take 10 mg by mouth daily.    . dabigatran (PRADAXA) 150 MG CAPS capsule Take 150 mg by mouth 2 (two) times daily.    Marland Kitchen estradiol (ESTRACE) 0.1 MG/GM vaginal cream Place 1 Applicatorful vaginally at bedtime.    . hydrochlorothiazide (HYDRODIURIL) 12.5 MG tablet Take  by mouth.    Marland Kitchen LACTOBACILLUS PO Take by mouth.    . levothyroxine (SYNTHROID, LEVOTHROID) 50 MCG tablet Take 75 mcg by mouth daily before breakfast.     . Multiple Vitamin (MULTIVITAMIN) capsule Take 1 capsule by mouth daily.    . naphazoline-glycerin (CLEAR EYES REDNESS) 0.012-0.2 % SOLN Apply to eye.    . primidone (MYSOLINE) 50 MG tablet Take 50 mg by mouth 3 (three) times daily.    . propranolol (INDERAL) 60 MG tablet Take 60 mg by mouth daily.    . fluticasone (FLONASE) 50 MCG/ACT nasal spray Place into both nostrils daily.    . traMADol (ULTRAM) 50 MG tablet Take 1 tablet (50 mg total) by mouth every 6 (six) hours as needed. (Patient not taking: Reported on 08/20/2017) 9 tablet 0   No current facility-administered medications on file prior to visit.  There are no Patient Instructions on file for this visit. No Follow-up on file.  Royce Stegman A Jvon Meroney, PA-C

## 2018-04-07 ENCOUNTER — Emergency Department: Payer: Medicare Other

## 2018-04-07 ENCOUNTER — Encounter: Payer: Self-pay | Admitting: *Deleted

## 2018-04-07 ENCOUNTER — Inpatient Hospital Stay
Admission: EM | Admit: 2018-04-07 | Discharge: 2018-04-10 | DRG: 640 | Disposition: A | Discharge status: Skilled Nursing Facility | Payer: Medicare Other | Attending: Internal Medicine | Admitting: Internal Medicine

## 2018-04-07 ENCOUNTER — Other Ambulatory Visit: Payer: Self-pay

## 2018-04-07 DIAGNOSIS — Z79890 Hormone replacement therapy (postmenopausal): Secondary | ICD-10-CM | POA: Diagnosis not present

## 2018-04-07 DIAGNOSIS — Z23 Encounter for immunization: Secondary | ICD-10-CM

## 2018-04-07 DIAGNOSIS — M79604 Pain in right leg: Secondary | ICD-10-CM

## 2018-04-07 DIAGNOSIS — E43 Unspecified severe protein-calorie malnutrition: Secondary | ICD-10-CM | POA: Diagnosis present

## 2018-04-07 DIAGNOSIS — S7001XA Contusion of right hip, initial encounter: Secondary | ICD-10-CM | POA: Diagnosis present

## 2018-04-07 DIAGNOSIS — E876 Hypokalemia: Secondary | ICD-10-CM | POA: Diagnosis present

## 2018-04-07 DIAGNOSIS — Z885 Allergy status to narcotic agent status: Secondary | ICD-10-CM

## 2018-04-07 DIAGNOSIS — M25572 Pain in left ankle and joints of left foot: Secondary | ICD-10-CM

## 2018-04-07 DIAGNOSIS — Z91041 Radiographic dye allergy status: Secondary | ICD-10-CM

## 2018-04-07 DIAGNOSIS — Z888 Allergy status to other drugs, medicaments and biological substances status: Secondary | ICD-10-CM | POA: Diagnosis not present

## 2018-04-07 DIAGNOSIS — E871 Hypo-osmolality and hyponatremia: Principal | ICD-10-CM

## 2018-04-07 DIAGNOSIS — Z881 Allergy status to other antibiotic agents status: Secondary | ICD-10-CM

## 2018-04-07 DIAGNOSIS — I10 Essential (primary) hypertension: Secondary | ICD-10-CM | POA: Diagnosis present

## 2018-04-07 DIAGNOSIS — M25551 Pain in right hip: Secondary | ICD-10-CM

## 2018-04-07 DIAGNOSIS — Z880 Allergy status to penicillin: Secondary | ICD-10-CM | POA: Diagnosis not present

## 2018-04-07 DIAGNOSIS — W010XXA Fall on same level from slipping, tripping and stumbling without subsequent striking against object, initial encounter: Secondary | ICD-10-CM | POA: Diagnosis present

## 2018-04-07 DIAGNOSIS — Z85828 Personal history of other malignant neoplasm of skin: Secondary | ICD-10-CM

## 2018-04-07 DIAGNOSIS — Z8249 Family history of ischemic heart disease and other diseases of the circulatory system: Secondary | ICD-10-CM

## 2018-04-07 DIAGNOSIS — N3001 Acute cystitis with hematuria: Secondary | ICD-10-CM | POA: Diagnosis present

## 2018-04-07 DIAGNOSIS — Z91013 Allergy to seafood: Secondary | ICD-10-CM

## 2018-04-07 DIAGNOSIS — Z950 Presence of cardiac pacemaker: Secondary | ICD-10-CM | POA: Diagnosis not present

## 2018-04-07 DIAGNOSIS — M25472 Effusion, left ankle: Secondary | ICD-10-CM | POA: Diagnosis present

## 2018-04-07 DIAGNOSIS — Z90710 Acquired absence of both cervix and uterus: Secondary | ICD-10-CM

## 2018-04-07 DIAGNOSIS — M25511 Pain in right shoulder: Secondary | ICD-10-CM

## 2018-04-07 DIAGNOSIS — Z9089 Acquired absence of other organs: Secondary | ICD-10-CM

## 2018-04-07 DIAGNOSIS — R296 Repeated falls: Secondary | ICD-10-CM | POA: Diagnosis present

## 2018-04-07 DIAGNOSIS — Z7901 Long term (current) use of anticoagulants: Secondary | ICD-10-CM

## 2018-04-07 DIAGNOSIS — R52 Pain, unspecified: Secondary | ICD-10-CM

## 2018-04-07 DIAGNOSIS — I482 Chronic atrial fibrillation, unspecified: Secondary | ICD-10-CM | POA: Diagnosis present

## 2018-04-07 DIAGNOSIS — W19XXXA Unspecified fall, initial encounter: Secondary | ICD-10-CM

## 2018-04-07 DIAGNOSIS — E039 Hypothyroidism, unspecified: Secondary | ICD-10-CM | POA: Diagnosis present

## 2018-04-07 DIAGNOSIS — Z66 Do not resuscitate: Secondary | ICD-10-CM | POA: Diagnosis present

## 2018-04-07 DIAGNOSIS — Y92009 Unspecified place in unspecified non-institutional (private) residence as the place of occurrence of the external cause: Secondary | ICD-10-CM

## 2018-04-07 HISTORY — DX: Essential (primary) hypertension: I10

## 2018-04-07 LAB — CBC WITH DIFFERENTIAL/PLATELET
Abs Immature Granulocytes: 0.08 10*3/uL — ABNORMAL HIGH (ref 0.00–0.07)
BASOS ABS: 0 10*3/uL (ref 0.0–0.1)
Basophils Relative: 0 %
EOS PCT: 0 %
Eosinophils Absolute: 0.1 10*3/uL (ref 0.0–0.5)
HEMATOCRIT: 34.3 % — AB (ref 36.0–46.0)
Hemoglobin: 11.6 g/dL — ABNORMAL LOW (ref 12.0–15.0)
Immature Granulocytes: 1 %
LYMPHS PCT: 6 %
Lymphs Abs: 0.9 10*3/uL (ref 0.7–4.0)
MCH: 32.2 pg (ref 26.0–34.0)
MCHC: 33.8 g/dL (ref 30.0–36.0)
MCV: 95.3 fL (ref 80.0–100.0)
MONOS PCT: 8 %
Monocytes Absolute: 1.1 10*3/uL — ABNORMAL HIGH (ref 0.1–1.0)
NRBC: 0 % (ref 0.0–0.2)
Neutro Abs: 12.8 10*3/uL — ABNORMAL HIGH (ref 1.7–7.7)
Neutrophils Relative %: 85 %
Platelets: 144 10*3/uL — ABNORMAL LOW (ref 150–400)
RBC: 3.6 MIL/uL — ABNORMAL LOW (ref 3.87–5.11)
RDW: 12.9 % (ref 11.5–15.5)
WBC: 15 10*3/uL — ABNORMAL HIGH (ref 4.0–10.5)

## 2018-04-07 LAB — BASIC METABOLIC PANEL
Anion gap: 9 (ref 5–15)
BUN: 27 mg/dL — ABNORMAL HIGH (ref 8–23)
CHLORIDE: 93 mmol/L — AB (ref 98–111)
CO2: 22 mmol/L (ref 22–32)
Calcium: 8.4 mg/dL — ABNORMAL LOW (ref 8.9–10.3)
Creatinine, Ser: 0.99 mg/dL (ref 0.44–1.00)
GFR calc non Af Amer: 50 mL/min — ABNORMAL LOW (ref >60)
GFR, EST AFRICAN AMERICAN: 58 mL/min — AB (ref >60)
Glucose, Bld: 126 mg/dL — ABNORMAL HIGH (ref 70–99)
POTASSIUM: 3.4 mmol/L — AB (ref 3.5–5.1)
Sodium: 124 mmol/L — ABNORMAL LOW (ref 135–145)

## 2018-04-07 LAB — URINALYSIS, COMPLETE (UACMP) WITH MICROSCOPIC
Bilirubin Urine: NEGATIVE
Glucose, UA: NEGATIVE mg/dL
Ketones, ur: 20 mg/dL — AB
Nitrite: POSITIVE — AB
Protein, ur: 30 mg/dL — AB
SPECIFIC GRAVITY, URINE: 1.013 (ref 1.005–1.030)
pH: 6 (ref 5.0–8.0)

## 2018-04-07 LAB — MAGNESIUM: Magnesium: 2.1 mg/dL (ref 1.7–2.4)

## 2018-04-07 LAB — TROPONIN I: TROPONIN I: 0.04 ng/mL — AB (ref <0.03)

## 2018-04-07 LAB — TSH: TSH: 0.236 u[IU]/mL — AB (ref 0.350–4.500)

## 2018-04-07 MED ORDER — FLUTICASONE PROPIONATE 50 MCG/ACT NA SUSP
1.0000 | Freq: Every day | NASAL | Status: DC
  Administered 2018-04-08 – 2018-04-10 (×3): 1 via NASAL
  Filled 2018-04-07: qty 16

## 2018-04-07 MED ORDER — POTASSIUM CHLORIDE CRYS ER 20 MEQ PO TBCR
20.0000 meq | EXTENDED_RELEASE_TABLET | Freq: Two times a day (BID) | ORAL | Status: DC
  Administered 2018-04-08 – 2018-04-10 (×6): 20 meq via ORAL
  Filled 2018-04-07 (×7): qty 1

## 2018-04-07 MED ORDER — PROPRANOLOL HCL 40 MG PO TABS
60.0000 mg | ORAL_TABLET | Freq: Every day | ORAL | Status: DC
  Administered 2018-04-08 – 2018-04-10 (×3): 60 mg via ORAL
  Filled 2018-04-07 (×3): qty 1

## 2018-04-07 MED ORDER — LEVOTHYROXINE SODIUM 50 MCG PO TABS
75.0000 ug | ORAL_TABLET | Freq: Every day | ORAL | Status: DC
  Administered 2018-04-08 – 2018-04-10 (×3): 75 ug via ORAL
  Filled 2018-04-07 (×3): qty 1

## 2018-04-07 MED ORDER — DOCUSATE SODIUM 100 MG PO CAPS
100.0000 mg | ORAL_CAPSULE | Freq: Two times a day (BID) | ORAL | Status: DC | PRN
  Administered 2018-04-09 – 2018-04-10 (×2): 100 mg via ORAL
  Filled 2018-04-07 (×2): qty 1

## 2018-04-07 MED ORDER — NAPHAZOLINE-GLYCERIN 0.012-0.2 % OP SOLN
2.0000 [drp] | Freq: Four times a day (QID) | OPHTHALMIC | Status: DC | PRN
  Filled 2018-04-07: qty 15

## 2018-04-07 MED ORDER — OXYCODONE-ACETAMINOPHEN 5-325 MG PO TABS
1.0000 | ORAL_TABLET | Freq: Four times a day (QID) | ORAL | Status: DC | PRN
  Administered 2018-04-08 (×2): 1 via ORAL
  Filled 2018-04-07 (×2): qty 1

## 2018-04-07 MED ORDER — SODIUM CHLORIDE 0.9 % IV SOLN
2.0000 g | Freq: Once | INTRAVENOUS | Status: AC
  Administered 2018-04-07: 2 g via INTRAVENOUS
  Filled 2018-04-07: qty 2

## 2018-04-07 MED ORDER — SODIUM CHLORIDE 0.9 % IV SOLN
INTRAVENOUS | Status: DC
  Administered 2018-04-07 – 2018-04-09 (×4): via INTRAVENOUS

## 2018-04-07 MED ORDER — INFLUENZA VAC SPLIT HIGH-DOSE 0.5 ML IM SUSY
0.5000 mL | PREFILLED_SYRINGE | INTRAMUSCULAR | Status: AC
  Administered 2018-04-10: 0.5 mL via INTRAMUSCULAR
  Filled 2018-04-07 (×2): qty 0.5

## 2018-04-07 MED ORDER — PRIMIDONE 50 MG PO TABS
50.0000 mg | ORAL_TABLET | Freq: Three times a day (TID) | ORAL | Status: DC
  Administered 2018-04-08 – 2018-04-10 (×9): 50 mg via ORAL
  Filled 2018-04-07 (×10): qty 1

## 2018-04-07 MED ORDER — ADULT MULTIVITAMIN W/MINERALS CH
1.0000 | ORAL_TABLET | Freq: Every day | ORAL | Status: DC
  Administered 2018-04-08 – 2018-04-10 (×3): 1 via ORAL
  Filled 2018-04-07 (×3): qty 1

## 2018-04-07 MED ORDER — DABIGATRAN ETEXILATE MESYLATE 150 MG PO CAPS
150.0000 mg | ORAL_CAPSULE | Freq: Two times a day (BID) | ORAL | Status: DC
  Administered 2018-04-08 – 2018-04-10 (×6): 150 mg via ORAL
  Filled 2018-04-07 (×8): qty 1

## 2018-04-07 MED ORDER — ESTRADIOL 0.1 MG/GM VA CREA
1.0000 | TOPICAL_CREAM | Freq: Every day | VAGINAL | Status: DC
  Administered 2018-04-08 – 2018-04-09 (×2): 1 via VAGINAL
  Filled 2018-04-07: qty 42.5

## 2018-04-07 MED ORDER — LEVOFLOXACIN IN D5W 250 MG/50ML IV SOLN
250.0000 mg | INTRAVENOUS | Status: DC
  Administered 2018-04-08: 250 mg via INTRAVENOUS
  Filled 2018-04-07: qty 50

## 2018-04-07 NOTE — ED Triage Notes (Signed)
Pt is here by EMS from home due to generalized weakness and falls.  Pt fell yesterday and again today.  No injuries from fall.  Pt states that "sometimes my right shoulder hurts" but no other complaints.  Pt is alert and oriented.  Pt attributes the falls and generalized weakness to her UTI which she was recently diagnosed with and for which she has been taking antibiotics since yesterday. No focal weakness, no dizziness.

## 2018-04-07 NOTE — ED Notes (Signed)
Daughter called and spoke with pt

## 2018-04-07 NOTE — ED Notes (Signed)
Report given to Alicia RN.

## 2018-04-07 NOTE — ED Notes (Signed)
Pt assisted with bed pan

## 2018-04-07 NOTE — ED Provider Notes (Signed)
[facility] Emergency Department Provider Note    First MD Initiated Contact with Patient 04/07/18 1736     (approximate)  I have reviewed the triage vital signs and the nursing notes.   HISTORY  Chief Complaint Fall    HPI Megan Rodgers is a 84 y.o. female the history of A. fib on Pradaxa presents the ER from home due to generalized weakness and increased falls over the past 24 hours.  Patient was started on antibiotic for UTI when taking 3 doses of antibiotics.  Denies any chest pain.  Did not hit her head but does feel more generalized weakness.  States her balance is "off "feels like previous times that she is gotten urinary tract infections.  Denies any chest pain or shortness of breath.  Does have mild pain in the right shoulder after the fall.  Denies any neck pain.    Past Medical History:  Diagnosis Date  . Atrial fibrillation ([place])   . Cancer ([place])    skin ca  . Thyroid disease   . Tremors of nervous system    Family History  Problem Relation Age of Onset  . Breast cancer Neg Hx    Past Surgical History:  Procedure Laterality Date  . ABDOMINAL HYSTERECTOMY    . BREAST CYST ASPIRATION Left    lt fna-neg  . CARDIAC ELECTROPHYSIOLOGY STUDY AND ABLATION    . PACEMAKER INSERTION    . TONSILLECTOMY     Patient Active Problem List   Diagnosis Date Noted  . Pain in joint involving ankle and foot 08/20/2017  . Essential hypertension 08/20/2017      Prior to Admission medications   Medication Sig Start Date End Date Taking? Authorizing Provider  Cholecalciferol (VITAMIN D-3 PO) Take by mouth.    [provider]  citalopram (CELEXA) 10 MG tablet Take 10 mg by mouth daily.    [provider]  dabigatran (PRADAXA) 150 MG CAPS capsule Take 150 mg by mouth 2 (two) times daily.    [provider]  estradiol (ESTRACE) 0.1 MG/GM vaginal cream Place 1 Applicatorful vaginally at bedtime.    [provider]    fluticasone (FLONASE) 50 MCG/ACT nasal spray Place into both nostrils daily.    [provider]  hydrochlorothiazide (HYDRODIURIL) 12.5 MG tablet Take by mouth. 07/07/17 07/07/18  [provider]  LACTOBACILLUS PO Take by mouth.    [provider]  levothyroxine (SYNTHROID, LEVOTHROID) 50 MCG tablet Take 75 mcg by mouth daily before breakfast.     [provider]  Multiple Vitamin (MULTIVITAMIN) capsule Take 1 capsule by mouth daily.    [provider]  naphazoline-glycerin (CLEAR EYES REDNESS) 0.012-0.2 % SOLN Apply to eye.    [provider]  primidone (MYSOLINE) 50 MG tablet Take 50 mg by mouth 3 (three) times daily.    [provider]  propranolol (INDERAL) 60 MG tablet Take 60 mg by mouth daily.    [provider]  traMADol (ULTRAM) 50 MG tablet Take 1 tablet (50 mg total) by mouth every 6 (six) hours as needed. Patient not taking: Reported on 08/20/2017 12/22/14   Victorino Dike, FNP    Allergies Penicillins; Codeine; Iodinated diagnostic agents; Metronidazole; Shellfish-derived products; and Tetracyclines & related    Social History Social History   Tobacco Use  . Smoking status: Never Smoker  . Smokeless tobacco: Never Used  Substance Use Topics  . Alcohol use: No  . Drug use: No  Review of Systems Patient denies headaches, rhinorrhea, blurry vision, numbness, shortness of breath, chest pain, edema, cough, abdominal pain, nausea, vomiting, diarrhea, dysuria, fevers, rashes or hallucinations unless otherwise stated above in HPI. ____________________________________________   PHYSICAL EXAM:  VITAL SIGNS: Vitals:   04/07/18 1710  BP: 136/60  Pulse: 77  Resp: 18  Temp: 98.1 F (36.7 C)  SpO2: 96%    Constitutional: Alert and oriented.  Eyes: Conjunctivae are normal.  Head: Atraumatic. Nose: No congestion/rhinnorhea. Mouth/Throat: Mucous membranes are moist.   Neck: No stridor. Painless  ROM.  Cardiovascular: Normal rate, regular rhythm. Grossly normal heart sounds.  Good peripheral circulation. Respiratory: Normal respiratory effort.  No retractions. Lungs CTAB. Gastrointestinal: Soft and nontender. No distention. No abdominal bruits. No CVA tenderness. Genitourinary:  Musculoskeletal: No lower extremity tenderness nor edema.  No joint effusions. Neurologic:  Normal speech and language. No gross focal neurologic deficits are appreciated. No facial droop Skin:  Skin is warm, dry and intact. No rash noted. Psychiatric: Mood and affect are normal. Speech and behavior are normal.  ____________________________________________   LABS (all labs ordered are listed, but only abnormal results are displayed)  Results for orders placed or performed during the hospital encounter of 04/07/18 (from the past 24 hour(s))  CBC with Differential/Platelet     Status: Abnormal   Collection Time: 04/07/18  5:58 PM  Result Value Ref Range   WBC 15.0 (H) 4.0 - 10.5 K/uL   RBC 3.60 (L) 3.87 - 5.11 MIL/uL   Hemoglobin 11.6 (L) 12.0 - 15.0 g/dL   HCT 34.3 (L) 36.0 - 46.0 %   MCV 95.3 80.0 - 100.0 fL   MCH 32.2 26.0 - 34.0 pg   MCHC 33.8 30.0 - 36.0 g/dL   RDW 12.9 11.5 - 15.5 %   Platelets 144 (L) 150 - 400 K/uL   nRBC 0.0 0.0 - 0.2 %   Neutrophils Relative % 85 %   Neutro Abs 12.8 (H) 1.7 - 7.7 K/uL   Lymphocytes Relative 6 %   Lymphs Abs 0.9 0.7 - 4.0 K/uL   Monocytes Relative 8 %   Monocytes Absolute 1.1 (H) 0.1 - 1.0 K/uL   Eosinophils Relative 0 %   Eosinophils Absolute 0.1 0.0 - 0.5 K/uL   Basophils Relative 0 %   Basophils Absolute 0.0 0.0 - 0.1 K/uL   Immature Granulocytes 1 %   Abs Immature Granulocytes 0.08 (H) 0.00 - 0.07 K/uL  Basic metabolic panel     Status: Abnormal   Collection Time: 04/07/18  6:39 PM  Result Value Ref Range   Sodium 124 (L) 135 - 145 mmol/L   Potassium 3.4 (L) 3.5 - 5.1 mmol/L   Chloride 93 (L) 98 - 111 mmol/L   CO2 22 22 - 32 mmol/L   Glucose,  Bld 126 (H) 70 - 99 mg/dL   BUN 27 (H) 8 - 23 mg/dL   Creatinine, Ser 0.99 0.44 - 1.00 mg/dL   Calcium 8.4 (L) 8.9 - 10.3 mg/dL   GFR calc non Af Amer 50 (L) >60 mL/min   GFR calc Af Amer 58 (L) >60 mL/min   Anion gap 9 5 - 15  Troponin I     Status: Abnormal   Collection Time: 04/07/18  6:39 PM  Result Value Ref Range   Troponin I 0.04 (HH) <0.03 ng/mL  Urinalysis, Complete w Microscopic     Status: Abnormal   Collection Time: 04/07/18  8:05 PM  Result Value Ref Range   Color,  Urine AMBER (A) YELLOW   APPearance CLEAR (A) CLEAR   Specific Gravity, Urine 1.013 1.005 - 1.030   pH 6.0 5.0 - 8.0   Glucose, UA NEGATIVE NEGATIVE mg/dL   Hgb urine dipstick MODERATE (A) NEGATIVE   Bilirubin Urine NEGATIVE NEGATIVE   Ketones, ur 20 (A) NEGATIVE mg/dL   Protein, ur 30 (A) NEGATIVE mg/dL   Nitrite POSITIVE (A) NEGATIVE   Leukocytes, UA TRACE (A) NEGATIVE   RBC / HPF 6-10 0 - 5 RBC/hpf   WBC, UA 21-50 0 - 5 WBC/hpf   Bacteria, UA MANY (A) NONE SEEN   Squamous Epithelial / LPF 0-5 0 - 5   Mucus PRESENT    Hyaline Casts, UA PRESENT    ____________________________________________  EKG My review and personal interpretation at Time: 18:25    Indication: fall  Rate: 75  Rhythm: v paced Axis: left Other: paced rhythm, abnormal ekg ____________________________________________  RADIOLOGY  I personally reviewed all radiographic images ordered to evaluate for the above acute complaints and reviewed radiology reports and findings.  These findings were personally discussed with the patient.  Please see medical record for radiology report.  ____________________________________________   PROCEDURES  Procedure(s) performed:  .Critical Care Performed by: Merlyn Lot, MD Authorized by: Merlyn Lot, MD   Critical care provider statement:    Critical care time (minutes):  41   Critical care time was exclusive of:  Separately billable procedures and treating other patients    Critical care was necessary to treat or prevent imminent or life-threatening deterioration of the following conditions:  Metabolic crisis   Critical care was time spent personally by me on the following activities:  Development of treatment plan with patient or surrogate, discussions with consultants, evaluation of patient's response to treatment, examination of patient, obtaining history from patient or surrogate, ordering and performing treatments and interventions, ordering and review of laboratory studies, ordering and review of radiographic studies, pulse oximetry, re-evaluation of patient's condition and review of old charts      Critical Care performed: yes ____________________________________________   INITIAL IMPRESSION / ASSESSMENT AND PLAN / ED COURSE  Pertinent labs & imaging results that were available during my care of the patient were reviewed by me and considered in my medical decision making (see chart for details).   DDX: uti, anemia, sepsis, dehydration, acs, pna, fracture  Megan Rodgers is a 84 y.o. who presents to the ED with weakness frequent falls recent initiation of antibiotics for UTI.  Patient afebrile hemodynamically stable.  No evidence of head injury.  No neck pain.  X-ray of chest and right shoulder will be ordered to evaluate for fracture dislocation.  Blood will be sent for the above differential.  Patient does appear clinically dehydrated therefore will give IV fluids.  The patient will be placed on continuous pulse oximetry and telemetry for monitoring.  Laboratory evaluation will be sent to evaluate for the above complaints.     Clinical Course as of Apr 07 2042  Tue Apr 07, 2018  [id number] Patient with evidence of hyponatremia down to 703 which certainly expand the patient's recurrent falls.  Still awaiting urinalysis.   [PR]    Clinical Course User Index [PR] Merlyn Lot, MD   Urinalysis does show evidence of acute cystitis.  We will give IV  antibiotics.  Patient will require admission the hospital for hyponatremia and acute cystitis.  As part of my medical decision making, I reviewed the following data within the [place]  Nursing notes reviewed and incorporated, Labs reviewed, notes from prior ED visits and [place] Controlled Substance Database   ____________________________________________   FINAL CLINICAL IMPRESSION(S) / ED DIAGNOSES  Final diagnoses:  Acute hyponatremia  Fall, initial encounter  Acute pain of right shoulder  Acute cystitis with hematuria      NEW MEDICATIONS STARTED DURING THIS VISIT:  New Prescriptions   No medications on file     Note:  This document was prepared using Dragon voice recognition software and may include unintentional dictation errors.    Merlyn Lot, MD 04/07/18 2043

## 2018-04-07 NOTE — ED Notes (Signed)
Recollect green sent to lab 

## 2018-04-07 NOTE — Progress Notes (Signed)
Pharmacy Antibiotic Note  Megan Rodgers is a 84 y.o. female admitted on 04/07/2018 with UTI.  Pharmacy has been consulted for levaquin  dosing.  Plan:  Pt received Aztreonam 2 gm IV X 1 in ED on 10/15 @ 21:00.  Levaquin 250 mg IV Q24H ordered to start on 10/16 @ 10:00.   Height: 5\' 3"  (160 cm) Weight: 132 lb (59.9 kg) IBW/kg (Calculated) : 52.4  Temp (24hrs), Avg:98.1 F (36.7 C), Min:98.1 F (36.7 C), Max:98.1 F (36.7 C)  Recent Labs  Lab 04/07/18 1758 04/07/18 1839  WBC 15.0*  --   CREATININE  --  0.99    Estimated Creatinine Clearance: 33.7 mL/min (by C-G formula based on SCr of 0.99 mg/dL).    Allergies  Allergen Reactions  . Penicillins Anaphylaxis  . Codeine   . Iodinated Diagnostic Agents   . Metronidazole   . Shellfish-Derived Products   . Tetracyclines & Related     Antimicrobials this admission:   >>    >>  Dose adjustments this admission:   Microbiology results:  BCx:   UCx:    Sputum:   MRSA PCR:   Thank you for allowing pharmacy to be a part of this patient's care.  Audre Cenci D 04/07/2018 9:51 PM

## 2018-04-07 NOTE — H&P (Signed)
[facility] [place] NAME: Megan Rodgers    MR#:  [id number]  DATE OF BIRTH:  03-Nov-1928  DATE OF ADMISSION:  04/07/2018  PRIMARY CARE PHYSICIAN: [facility]   REQUESTING/REFERRING PHYSICIAN: Quentin Cornwall  CHIEF COMPLAINT:   Chief Complaint  Patient presents with  . Fall    HISTORY OF PRESENT ILLNESS: Megan Rodgers  is a 84 y.o. female with a known history of atrial fibrillation, skin cancer, hypertension, thyroid disease, tremors-had symptoms of UTI and received some antibiotic by primary care physician which she took for last 2 days. She denies having fever or vomiting. She started having significant weakness and dizziness on trying to stand up and had multiple falls in the last few days. Concerned with this she came to emergency room.  Noted to have hyponatremia and so given to hospitalist team for further management.  PAST MEDICAL HISTORY:   Past Medical History:  Diagnosis Date  . Atrial fibrillation ([place])   . Cancer ([place])    skin ca  . Hypertension   . Thyroid disease   . Tremors of nervous system     PAST SURGICAL HISTORY:  Past Surgical History:  Procedure Laterality Date  . ABDOMINAL HYSTERECTOMY    . BREAST CYST ASPIRATION Left    lt fna-neg  . CARDIAC ELECTROPHYSIOLOGY STUDY AND ABLATION    . PACEMAKER INSERTION    . TONSILLECTOMY      SOCIAL HISTORY:  Social History   Tobacco Use  . Smoking status: Never Smoker  . Smokeless tobacco: Never Used  Substance Use Topics  . Alcohol use: No    FAMILY HISTORY:  Family History  Problem Relation Age of Onset  . Hypertension Mother   . Breast cancer Neg Hx     DRUG ALLERGIES:  Allergies  Allergen Reactions  . Penicillins Anaphylaxis  . Codeine   . Iodinated Diagnostic Agents   . Metronidazole   . Shellfish-Derived Products   . Tetracyclines & Related     REVIEW OF SYSTEMS:   CONSTITUTIONAL: No fever, have fatigue or weakness.   EYES: No blurred or double vision.  EARS, NOSE, AND THROAT: No tinnitus or ear pain.  RESPIRATORY: No cough, shortness of breath, wheezing or hemoptysis.  CARDIOVASCULAR: No chest pain, orthopnea, edema.  GASTROINTESTINAL: No nausea, vomiting, diarrhea or abdominal pain.  GENITOURINARY: Have dysuria, no hematuria.  ENDOCRINE: No polyuria, nocturia,  HEMATOLOGY: No anemia, easy bruising or bleeding SKIN: No rash or lesion. MUSCULOSKELETAL: No joint pain or arthritis.   NEUROLOGIC: No tingling, numbness, weakness.  PSYCHIATRY: No anxiety or depression.   MEDICATIONS AT HOME:  Prior to Admission medications   Medication Sig Start Date End Date Taking? Authorizing Provider  Cholecalciferol (VITAMIN D-3 PO) Take by mouth.    [provider]  citalopram (CELEXA) 10 MG tablet Take 10 mg by mouth daily.    [provider]  dabigatran (PRADAXA) 150 MG CAPS capsule Take 150 mg by mouth 2 (two) times daily.    [provider]  estradiol (ESTRACE) 0.1 MG/GM vaginal cream Place 1 Applicatorful vaginally at bedtime.    [provider]  fluticasone (FLONASE) 50 MCG/ACT nasal spray Place into both nostrils daily.    [provider]  hydrochlorothiazide (HYDRODIURIL) 12.5 MG tablet Take by mouth. 07/07/17 07/07/18  [provider]  LACTOBACILLUS PO Take by mouth.    [provider]  levothyroxine (SYNTHROID, LEVOTHROID) 50 MCG tablet Take 75 mcg by mouth daily  before breakfast.     [provider]  Multiple Vitamin (MULTIVITAMIN) capsule Take 1 capsule by mouth daily.    [provider]  naphazoline-glycerin (CLEAR EYES REDNESS) 0.012-0.2 % SOLN Apply to eye.    [provider]  primidone (MYSOLINE) 50 MG tablet Take 50 mg by mouth 3 (three) times daily.    [provider]  propranolol (INDERAL) 60 MG tablet Take 60 mg by mouth daily.    [provider]  traMADol (ULTRAM) 50 MG tablet Take 1 tablet  (50 mg total) by mouth every 6 (six) hours as needed. Patient not taking: Reported on 08/20/2017 12/22/14   Triplett, Johnette Abraham B, FNP      PHYSICAL EXAMINATION:   VITAL SIGNS: Blood pressure (!) 123/45, pulse 75, temperature 98.1 F (36.7 C), temperature source Oral, resp. rate 18, height 5\' 3"  (1.6 m), weight 59.9 kg, SpO2 100 %.  GENERAL:  84 y.o.-year-old patient lying in the bed with no acute distress.  EYES: Pupils equal, round, reactive to light and accommodation. No scleral icterus. Extraocular muscles intact.  HEENT: Head atraumatic, normocephalic. Oropharynx and nasopharynx clear.  NECK:  Supple, no jugular venous distention. No thyroid enlargement, no tenderness.  LUNGS: Normal breath sounds bilaterally, no wheezing, rales,rhonchi or crepitation. No use of accessory muscles of respiration.  CARDIOVASCULAR: S1, S2 normal. No murmurs, rubs, or gallops.  ABDOMEN: Soft, nontender, nondistended. Bowel sounds present. No organomegaly or mass.  EXTREMITIES: No pedal edema, cyanosis, or clubbing.  NEUROLOGIC: Cranial nerves II through XII are intact. Muscle strength 4/5 in all extremities. Sensation intact. Gait not checked.  PSYCHIATRIC: The patient is alert and oriented x 3.  SKIN: No obvious rash, lesion, or ulcer.   LABORATORY PANEL:   CBC Recent Labs  Lab 04/07/18 1758  WBC 15.0*  HGB 11.6*  HCT 34.3*  PLT 144*  MCV 95.3  MCH 32.2  MCHC 33.8  RDW 12.9  LYMPHSABS 0.9  MONOABS 1.1*  EOSABS 0.1  BASOSABS 0.0   ------------------------------------------------------------------------------------------------------------------  Chemistries  Recent Labs  Lab 04/07/18 1839  NA 124*  K 3.4*  CL 93*  CO2 22  GLUCOSE 126*  BUN 27*  CREATININE 0.99  CALCIUM 8.4*   ------------------------------------------------------------------------------------------------------------------ estimated creatinine clearance is 33.7 mL/min (by C-G formula based on SCr of 0.99  mg/dL). ------------------------------------------------------------------------------------------------------------------ Recent Labs    04/07/18 2059  TSH 0.236*     Coagulation profile No results for input(s): INR, PROTIME in the last 168 hours. ------------------------------------------------------------------------------------------------------------------- No results for input(s): DDIMER in the last 72 hours. -------------------------------------------------------------------------------------------------------------------  Cardiac Enzymes Recent Labs  Lab 04/07/18 1839  TROPONINI 0.04*   ------------------------------------------------------------------------------------------------------------------ Invalid input(s): POCBNP  ---------------------------------------------------------------------------------------------------------------  Urinalysis    Component Value Date/Time   COLORURINE AMBER (A) 04/07/2018 2005   APPEARANCEUR CLEAR (A) 04/07/2018 2005   LABSPEC 1.013 04/07/2018 2005   PHURINE 6.0 04/07/2018 2005   GLUCOSEU NEGATIVE 04/07/2018 2005   HGBUR MODERATE (A) 04/07/2018 2005   BILIRUBINUR NEGATIVE 04/07/2018 2005   KETONESUR 20 (A) 04/07/2018 2005   PROTEINUR 30 (A) 04/07/2018 2005   NITRITE POSITIVE (A) 04/07/2018 2005   LEUKOCYTESUR TRACE (A) 04/07/2018 2005     RADIOLOGY: Dg Shoulder Right  Result Date: 04/07/2018 CLINICAL DATA:  Generalize weakness and falling.  Shoulder pain. EXAM: RIGHT SHOULDER - 2+ VIEW COMPARISON:  None. FINDINGS: No evidence of fracture or dislocation. There are osteoarthritic changes at the [facility] joint. IMPRESSION: No acute or traumatic finding. Degenerative arthritis of the [facility] joint. Electronically Signed  By: Nelson Chimes M.D.   On: 04/07/2018 18:44   Dg Chest Portable 1 View  Result Date: 04/07/2018 CLINICAL DATA:  Generalize weakness and falling. EXAM: PORTABLE CHEST 1 VIEW COMPARISON:  03/11/2015 FINDINGS: Chronic  cardiomegaly. Dual lead pacemaker appears the same. Aortic atherosclerosis. There is interstitial edema. No alveolar edema at this time. No consolidation or collapse. No visible effusion. IMPRESSION: Interstitial edema pattern consistent with congestive heart failure. Electronically Signed   By: Nelson Chimes M.D.   On: 04/07/2018 18:43    EKG: Orders placed or performed during the hospital encounter of 04/07/18  . ED EKG  . ED EKG  . EKG 12-Lead  . EKG 12-Lead    IMPRESSION AND PLAN:  *Hyponatremia This could be due to UTI and use of hydrochlorothiazide. I will stop hydrochlorothiazide and give IV fluids and recheck.  *Hypothyroidism Continue levothyroxine and check TSH.  *UTI Give Levaquin IV and follow cultures.  *Frequent falls This is due to hyponatremia and UTI, physical therapy evaluation. Cardiac monitor.  *Atrial fibrillation Continue propranolol, pradaxa.  *Hypokalemia Replace oral and check magnesium.  All the records are reviewed and case discussed with ED provider. Management plans discussed with the patient, family and they are in agreement.  CODE STATUS: DNR.   TOTAL TIME TAKING CARE OF THIS PATIENT: 50 minutes.    Vaughan Basta M.D on 04/07/2018   Between 7am to 6pm - Pager - [phone]  After 6pm go to [email or web address] - password [email or web address] [facility]  Office  [phone]  CC: Primary care physician; [facility]   Note: This dictation was prepared with Dragon dictation along with smaller phrase technology. Any transcriptional errors that result from this process are unintentional.

## 2018-04-07 NOTE — ED Notes (Signed)
Patient transported to X-ray 

## 2018-04-07 NOTE — Progress Notes (Signed)
Family Meeting Note  Advance Directive:no  Today a meeting took place with the Patient.  The following clinical team members were present during this meeting:MD  The following were discussed:Patient's diagnosis: UTI, hyponatremia, dizziness and frequent fall, atrial fibrillation, Patient's progosis: Unable to determine and Goals for treatment: DNR  Additional follow-up to be provided: PT, PMD  Time spent during discussion:20 minutes  Vaughan Basta, MD

## 2018-04-08 ENCOUNTER — Inpatient Hospital Stay: Payer: Medicare Other

## 2018-04-08 DIAGNOSIS — E43 Unspecified severe protein-calorie malnutrition: Secondary | ICD-10-CM

## 2018-04-08 LAB — BASIC METABOLIC PANEL
ANION GAP: 11 (ref 5–15)
BUN: 27 mg/dL — ABNORMAL HIGH (ref 8–23)
CALCIUM: 8.2 mg/dL — AB (ref 8.9–10.3)
CHLORIDE: 95 mmol/L — AB (ref 98–111)
CO2: 21 mmol/L — AB (ref 22–32)
Creatinine, Ser: 0.99 mg/dL (ref 0.44–1.00)
GFR calc Af Amer: 58 mL/min — ABNORMAL LOW (ref >60)
GFR calc non Af Amer: 50 mL/min — ABNORMAL LOW (ref >60)
GLUCOSE: 123 mg/dL — AB (ref 70–99)
Potassium: 3.1 mmol/L — ABNORMAL LOW (ref 3.5–5.1)
Sodium: 127 mmol/L — ABNORMAL LOW (ref 135–145)

## 2018-04-08 LAB — CBC
HEMATOCRIT: 32.5 % — AB (ref 36.0–46.0)
Hemoglobin: 11 g/dL — ABNORMAL LOW (ref 12.0–15.0)
MCH: 31.8 pg (ref 26.0–34.0)
MCHC: 33.8 g/dL (ref 30.0–36.0)
MCV: 93.9 fL (ref 80.0–100.0)
Platelets: 145 10*3/uL — ABNORMAL LOW (ref 150–400)
RBC: 3.46 MIL/uL — AB (ref 3.87–5.11)
RDW: 12.9 % (ref 11.5–15.5)
WBC: 11.9 10*3/uL — ABNORMAL HIGH (ref 4.0–10.5)
nRBC: 0 % (ref 0.0–0.2)

## 2018-04-08 MED ORDER — SODIUM CHLORIDE 0.9 % IV SOLN
1.0000 g | INTRAVENOUS | Status: DC
  Filled 2018-04-08: qty 10

## 2018-04-08 MED ORDER — OXYCODONE-ACETAMINOPHEN 5-325 MG PO TABS
1.0000 | ORAL_TABLET | ORAL | Status: DC | PRN
  Administered 2018-04-08: 1 via ORAL
  Administered 2018-04-08: 2 via ORAL
  Administered 2018-04-09 – 2018-04-10 (×3): 1 via ORAL
  Administered 2018-04-10: 2 via ORAL
  Filled 2018-04-08 (×2): qty 1
  Filled 2018-04-08 (×2): qty 2
  Filled 2018-04-08 (×2): qty 1

## 2018-04-08 NOTE — Progress Notes (Signed)
Physical Therapy Evaluation Patient Details Name: Megan Rodgers MRN: [id number] DOB: [birth year] Today's Date: 04/08/2018   History of Present Illness  Pt presented to ED after fall and was admitted for hyponatremia on 04/07/2018. PMH includes A-fib, skin CA, HTN, thyroid disease, tremors of nervous system. Imaging showed an interstitial edema pattern consistent with CHF. Per pt has frequent falls, stated 3 falls this week.  Clinical Impression  Pt is a pleasant 84 year old female who was admitted for hyponatremia. Pt performs bed mobility with Mod A, +2 physical. Tolerated supine ther-ex fair requiring Min A for several exercises. Pt has R lateral posterior lean in sitting, potentially d/t greater pain in LE on L than R. Pt demonstrates deficits with strength, mobility, balance, and activity tolerance. Pt educated on HEP including frequency and duration. Pt is not at her baseline. Would benefit from skilled PT to address above deficits and promote optimal return to PLOF.    Follow Up Recommendations SNF    Equipment Recommendations  Rolling walker with 5" wheels    Recommendations for Other Services       Precautions / Restrictions Precautions Precautions: Fall Restrictions Weight Bearing Restrictions: No      Mobility  Bed Mobility Overal bed mobility: Needs Assistance Bed Mobility: Supine to Sit;Sit to Supine     Supine to sit: Mod assist;+2 for physical assistance Sit to supine: Mod assist;+2 for physical assistance   General bed mobility comments: Pt demonstrates ability to disassociate trunk reaching over toward handle of bed (R side). Requires physical assist to manage LE off EOB and assist to control trunk. No dizziness at EOB.  Transfers                 General transfer comment: Not assessed this date d/t dec trunk control and fatigue, unsafe to progress.  Ambulation/Gait             General Gait Details: Not assess this date.  Stairs             Wheelchair Mobility    Modified Rankin (Stroke Patients Only)       Balance Overall balance assessment: Needs assistance Sitting-balance support: Bilateral upper extremity supported;Feet supported Sitting balance-Leahy Scale: Poor Sitting balance - Comments: Pt requires intermittent Min-Mod A to maintain postural stability. Noted R lateral posterior lean. Pt uses BUE on mattress/handles to maintain upright posture. Can maintain indpendently with UE support >1 minute.  Postural control: Posterior lean;Right lateral lean                                   Pertinent Vitals/Pain Pain Assessment: 0-10 Pain Score: 6  Pain Location: BLE L>R Pain Descriptors / Indicators: Guarding;Constant;Sore Pain Intervention(s): Limited activity within patient's tolerance;Monitored during session    Dean expects to be discharged to:: Private residence Living Arrangements: Children(Son) Available Help at Discharge: Family;Available PRN/intermittently Type of Home: House Home Access: Stairs to enter Entrance Stairs-Rails: Left Entrance Stairs-Number of Steps: 3 Home Layout: One level Home Equipment: Cane - single point;Shower seat;Grab bars - toilet;Hand held shower head      Prior Function Level of Independence: Independent with assistive device(s)         Comments: Per pt is independent with SPC for ambulation. Performs ADL and IADL independently. States son takes out the garbage.     Hand Dominance   Dominant Hand: Right    Extremity/Trunk Assessment  Upper Extremity Assessment Upper Extremity Assessment: Generalized weakness(BUE 2+/5)    Lower Extremity Assessment Lower Extremity Assessment: Generalized weakness(BLE 2+/5)       Communication   Communication: No difficulties  Cognition Arousal/Alertness: Awake/alert(Initially a little drowsy on arrival, but quickly woke up.) Behavior During Therapy: WFL for tasks  assessed/performed Overall Cognitive Status: Within Functional Limits for tasks assessed                                 General Comments: Pt follows simple commands consistantly      General Comments      Exercises Other Exercises Other Exercises: Supine AROM ankle pumps and AAROM min A heel slides, hip ABD/ADD. All ther-ex performed 6 reps with VC for sequencing. Other Exercises: Seated reaching to target ~42ft away with BUE at EOB, both ipsilateral and contralaterally. Pt performed 8 reps each side. Intermittent min A to maintain upright posturing.   Assessment/Plan    PT Assessment Patient needs continued PT services  PT Problem List Decreased strength;Decreased activity tolerance;Decreased balance;Decreased mobility;Pain       PT Treatment Interventions DME instruction;Gait training;Stair training;Functional mobility training;Therapeutic activities;Therapeutic exercise;Balance training;Neuromuscular re-education;Patient/family education    PT Goals (Current goals can be found in the Care Plan section)  Acute Rehab PT Goals Patient Stated Goal: To be independent with ADL and IADL again. PT Goal Formulation: With patient Time For Goal Achievement: 04/22/18 Potential to Achieve Goals: Fair    Frequency Min 2X/week   Barriers to discharge        Co-evaluation               AM-PAC PT "6 Clicks" Daily Activity  Outcome Measure Difficulty turning over in bed (including adjusting bedclothes, sheets and blankets)?: Unable Difficulty moving from lying on back to sitting on the side of the bed? : Unable Difficulty sitting down on and standing up from a chair with arms (e.g., wheelchair, bedside commode, etc,.)?: Unable Help needed moving to and from a bed to chair (including a wheelchair)?: Total Help needed walking in hospital room?: Total Help needed climbing 3-5 steps with a railing? : Total 6 Click Score: 6    End of Session   Activity Tolerance:  Patient limited by fatigue Patient left: in bed;with call bell/phone within reach;with bed alarm set Nurse Communication: Mobility status PT Visit Diagnosis: Unsteadiness on feet (R26.81);Muscle weakness (generalized) (M62.81);Pain Pain - Right/Left: (Bilateral) Pain - part of body: Knee;Leg;Ankle and joints of foot    Time: 1132-1205 PT Time Calculation (min) (ACUTE ONLY): 33 min   Charges:              Algis Downs, SPT  04/08/2018, 1:28 PM

## 2018-04-08 NOTE — Progress Notes (Signed)
[facility] [place] NAME: Megan Rodgers    MR#:  [id number]  DATE OF BIRTH:  1928/07/28  SUBJECTIVE:  Patient complaining of right hip and leg pain this morning. She states she fell three times at home because she tripped on her feet. She states she is still feeling weak. No chest pain or shortness of breath.  REVIEW OF SYSTEMS:  Review of Systems  Constitutional: Positive for malaise/fatigue. Negative for chills and fever.  HENT: Negative for congestion and sore throat.   Eyes: Negative for blurred vision and double vision.  Respiratory: Negative for cough and shortness of breath.   Cardiovascular: Negative for chest pain, palpitations and leg swelling.  Gastrointestinal: Negative for abdominal pain, nausea and vomiting.  Genitourinary: Negative for dysuria and frequency.  Musculoskeletal: Positive for joint pain.  Neurological: Positive for weakness. Negative for dizziness and headaches.  Psychiatric/Behavioral: Negative for depression. The patient is not nervous/anxious.     DRUG ALLERGIES:   Allergies  Allergen Reactions  . Penicillins Anaphylaxis    Has patient had a PCN reaction causing immediate rash, facial/tongue/throat swelling, SOB or lightheadedness with hypotension: Yes Has patient had a PCN reaction causing severe rash involving mucus membranes or skin necrosis: No Has patient had a PCN reaction that required hospitalization: No Has patient had a PCN reaction occurring within the last 10 years: Unknown If all of the above answers are "NO", then may proceed with Cephalosporin use.   . Codeine   . Iodinated Diagnostic Agents   . Metronidazole   . Shellfish-Derived Products   . Tetracyclines & Related    VITALS:  Blood pressure (!) 96/50, pulse 87, temperature 97.7 F (36.5 C), temperature source Oral, resp. rate 19, height 5\' 3"  (1.6 m), weight 51.3 kg, SpO2 99 %. PHYSICAL EXAMINATION:  Physical Exam  GENERAL:   84 y.o.-year-old patient lying in the bed with no acute distress.  EYES: Pupils equal, round, reactive to light and accommodation. No scleral icterus. Extraocular muscles intact.  HEENT: Head atraumatic, normocephalic. Oropharynx and nasopharynx clear. Moist mucous membranes. NECK:  Supple, no jugular venous distention. No thyroid enlargement, no tenderness.  LUNGS: Normal breath sounds bilaterally, no wheezing, rales,rhonchi or crepitation. No use of accessory muscles of respiration.  CARDIOVASCULAR: RRR, S1, S2 normal. No murmurs, rubs, or gallops.  ABDOMEN: Soft, nontender, nondistended. Bowel sounds present. No organomegaly or mass.  EXTREMITIES: No pedal edema, cyanosis, or clubbing. +right hip ecchymosis. NEUROLOGIC: Cranial nerves II through XII are intact. +global weakness. Sensation intact. Gait not checked.  PSYCHIATRIC: The patient is alert and oriented x 3.  SKIN: No obvious rash, lesion, or ulcer.  LABORATORY PANEL:  Female CBC Recent Labs  Lab 04/08/18 0601  WBC 11.9*  HGB 11.0*  HCT 32.5*  PLT 145*   ------------------------------------------------------------------------------------------------------------------ Chemistries  Recent Labs  Lab 04/07/18 1839 04/08/18 0601  NA 124* 127*  K 3.4* 3.1*  CL 93* 95*  CO2 22 21*  GLUCOSE 126* 123*  BUN 27* 27*  CREATININE 0.99 0.99  CALCIUM 8.4* 8.2*  MG 2.1  --    RADIOLOGY:  Dg Shoulder Right  Result Date: 04/07/2018 CLINICAL DATA:  Generalize weakness and falling.  Shoulder pain. EXAM: RIGHT SHOULDER - 2+ VIEW COMPARISON:  None. FINDINGS: No evidence of fracture or dislocation. There are osteoarthritic changes at the [facility] joint. IMPRESSION: No acute or traumatic finding. Degenerative arthritis of the [facility] joint. Electronically Signed   By: Jan Fireman.D.  On: 04/07/2018 18:44   Dg Chest Portable 1 View  Result Date: 04/07/2018 CLINICAL DATA:  Generalize weakness and falling. EXAM: PORTABLE CHEST 1 VIEW  COMPARISON:  03/11/2015 FINDINGS: Chronic cardiomegaly. Dual lead pacemaker appears the same. Aortic atherosclerosis. There is interstitial edema. No alveolar edema at this time. No consolidation or collapse. No visible effusion. IMPRESSION: Interstitial edema pattern consistent with congestive heart failure. Electronically Signed   By: Nelson Chimes M.D.   On: 04/07/2018 18:43   ASSESSMENT AND PLAN:   Acute hyponatremia- improving. Na 124 > 127. Likely secondary to UTI and HCTZ - discontinue HCTZ and will not restart on discharge - continue IVFs  Hypothyroidism- TSH low at 0.236 - check T3 and T4 - continue synthroid - needs thyroid studies rechecked as outpatient  UTI - change levaquin IV to ceftriaxone due to patient's age - urine culture pending  Right hip and leg pain- due to frequent falls. Patient with right hip bruise. - will order right hip and leg x-rays to rule out fracture - PT consult   Chronic atrial fibrillation- rate controlled. Patient has pacemaker.  - continue propranolol and pradaxa  Hypokalemia- K 3.1, mag 2.1 - replete  All the records are reviewed and case discussed with Care Management/Social Worker. Management plans discussed with the patient, family and they are in agreement.  CODE STATUS: DNR  TOTAL TIME TAKING CARE OF THIS PATIENT: 35 minutes.   More than 50% of the time was spent in counseling/coordination of care: YES  POSSIBLE D/C IN 1-2 DAYS, DEPENDING ON CLINICAL CONDITION.   Berna Spare Clearence Vitug M.D on 04/08/2018 at 4:19 PM  Between 7am to 6pm - Pager - [phone]  After 6pm go to [email or web address] - Proofreader  [facility]  Office  [phone]  CC: Primary care physician; [facility]  Note: This dictation was prepared with Dragon dictation along with smaller phrase technology. Any transcriptional errors that result from this process are unintentional.
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
## 2018-04-08 NOTE — Care Management Note (Addendum)
Case Management Note  Patient Details  Name: SKYLIN KENNERSON MRN: [id number] Date of Birth: 1928/11/07  Subjective/Objective:      Patient admitted with recent fall and hyponatremia.  Lives with son who has recently been admitted for seizure activity and currently has home health.  Daughter states she has had 3 falls in the last week.  Patient walks with a cane but states her feet are very sore and it is almost impossible to walk.  PT evaluation was done today and they have recommended SNF placement and rolling walker.  Spoke to patient and her daughter about the recommendation and they are in agreement with recommendation.    Denies issues accessing medical care, obtaining medications or with transportation.  Current with PCP.                  Action/Plan: Notified Randall Hiss, [place].     Expected Discharge Date:                  Expected Discharge Plan:  [place]  In-House Referral:     Discharge planning Services  CM Consult  Post Acute Care Choice:    Choice offered to:     DME Arranged:    DME Agency:     HH Arranged:    [place] Agency:     Status of Service:  In process, will continue to follow  If discussed at Long Length of Stay Meetings, dates discussed:    Additional Comments:  Elza Rafter, RN 04/08/2018, 4:47 PM

## 2018-04-08 NOTE — Progress Notes (Signed)
Initial Nutrition Assessment  DOCUMENTATION CODES:   Severe malnutrition in context of acute illness/injury  INTERVENTION:   - Recommend SLP consult for swallow evaluation  - Continue MVI with minerals daily  - Liberalize diet to Regular  NUTRITION DIAGNOSIS:   Severe Malnutrition related to acute illness (hyponatremia, UTI) as evidenced by moderate fat depletion, moderate muscle depletion.  GOAL:   Patient will meet greater than or equal to 90% of their needs  MONITOR:   PO intake, Weight trends, Labs  REASON FOR ASSESSMENT:   Malnutrition Screening Tool    ASSESSMENT:   84 year old female who presented to the ED on 10/15 after a fall. PMH significant for atrial fibrillation, skin cancer, hypertension, thyroid disease, and tremors. Pt found to have hyponatremia. Pt also with UTI.  Spoke with pt and daughter at bedside. RN in room providing nursing care.  Pt reports having no appetite and eating very little over the past "several" days. Pt reports that when she is feeling well, she has a good appetite and eats 3 times each day. Pt states that she drinks mostly water throughout the day.  Breakfast: Cheerios with almond milk Lunch: salad with ranch dressing Dinner: oatmeal  Pt shares that she did not eat yesterday due to feeling so poorly. Pt also with complaints of pain in BLE that began "when I fell."  Discussed diet order with MD who approved liberalizing diet to Regular.  Pt denies recent weight loss and states that her UBW is 131 lbs and that she has weighed this "for a good long while." It appears as if weight on admission was stated rather than measured. Weight history in chart is limited as last weight PTA is from 2016.  Pt states that when she eats, she "gets strangled a lot." Pt reports having a hard time swallowing. Noted pt struggling to drink water from straw when taking her medications. Recommend SLP consult.  Pt shares that she does not like oral  nutrition supplements. RD will refrain from ordering these at this time and monitor PO intake.  Medications reviewed and include: levothyroxine 75 mcg daily, MVI with minerals daily, K-dur 20 mEq BID, NaCl @ 100 ml/hr  Labs reviewed: sodium 127 (L), potassium 3.1 (L) - being replaced, chloride 95 (L), CO2 21 (L), BUN 27 (H)  NUTRITION - FOCUSED PHYSICAL EXAM:    Most Recent Value  Orbital Region  Moderate depletion  Upper Arm Region  Moderate depletion  Thoracic and Lumbar Region  Moderate depletion  Buccal Region  Mild depletion  Temple Region  Moderate depletion  Clavicle Bone Region  Moderate depletion  Clavicle and Acromion Bone Region  Moderate depletion  Scapular Bone Region  Unable to assess  Dorsal Hand  Mild depletion  Patellar Region  Mild depletion  Anterior Thigh Region  Moderate depletion  Posterior Calf Region  Moderate depletion  Edema (RD Assessment)  None  Hair  Reviewed  Eyes  Reviewed  Mouth  Reviewed  Skin  Reviewed  Nails  Reviewed       Diet Order:   Diet Order            Diet regular Room service appropriate? Yes; Fluid consistency: Thin  Diet effective now              EDUCATION NEEDS:   No education needs have been identified at this time  Skin:  Skin Assessment: Reviewed RN Assessment  Last BM:  10/15  Height:   Ht Readings from Last 1  Encounters:  04/07/18 5\' 3"  (1.6 m)    Weight:   Wt Readings from Last 1 Encounters:  04/07/18 51.3 kg    Ideal Body Weight:  52.27 kg  BMI:  Body mass index is 20.02 kg/m.  Estimated Nutritional Needs:   Kcal:  1400-1600  Protein:  65-80 grams  Fluid:  1.4-1.6 L    Megan Face, MS, RD, LDN Inpatient Clinical Dietitian Pager: [phone] Weekend/After Hours: [phone]

## 2018-04-09 ENCOUNTER — Inpatient Hospital Stay: Payer: Medicare Other

## 2018-04-09 LAB — BASIC METABOLIC PANEL
Anion gap: 5 (ref 5–15)
BUN: 25 mg/dL — ABNORMAL HIGH (ref 8–23)
CO2: 25 mmol/L (ref 22–32)
CREATININE: 0.84 mg/dL (ref 0.44–1.00)
Calcium: 8 mg/dL — ABNORMAL LOW (ref 8.9–10.3)
Chloride: 96 mmol/L — ABNORMAL LOW (ref 98–111)
GFR calc Af Amer: >60 mL/min (ref >60)
GLUCOSE: 95 mg/dL (ref 70–99)
Potassium: 3.4 mmol/L — ABNORMAL LOW (ref 3.5–5.1)
SODIUM: 126 mmol/L — AB (ref 135–145)

## 2018-04-09 LAB — T4, FREE: FREE T4: 1.09 ng/dL (ref 0.82–1.77)

## 2018-04-09 LAB — URINE CULTURE

## 2018-04-09 LAB — CBC
HCT: 33.1 % — ABNORMAL LOW (ref 36.0–46.0)
Hemoglobin: 11 g/dL — ABNORMAL LOW (ref 12.0–15.0)
MCH: 31.5 pg (ref 26.0–34.0)
MCHC: 33.2 g/dL (ref 30.0–36.0)
MCV: 94.8 fL (ref 80.0–100.0)
PLATELETS: 157 10*3/uL (ref 150–400)
RBC: 3.49 MIL/uL — ABNORMAL LOW (ref 3.87–5.11)
RDW: 12.9 % (ref 11.5–15.5)
WBC: 11.2 10*3/uL — ABNORMAL HIGH (ref 4.0–10.5)
nRBC: 0 % (ref 0.0–0.2)

## 2018-04-09 MED ORDER — CEPHALEXIN 500 MG PO CAPS
500.0000 mg | ORAL_CAPSULE | Freq: Two times a day (BID) | ORAL | Status: DC
  Administered 2018-04-09 – 2018-04-10 (×3): 500 mg via ORAL
  Filled 2018-04-09 (×3): qty 1

## 2018-04-09 MED ORDER — SODIUM CHLORIDE 0.9 % IV SOLN
INTRAVENOUS | Status: DC
  Administered 2018-04-09 – 2018-04-10 (×3): via INTRAVENOUS

## 2018-04-09 MED ORDER — CEPHALEXIN 500 MG PO CAPS: 500.0000 mg | ORAL_CAPSULE | Freq: Three times a day (TID) | ORAL | Status: DC

## 2018-04-09 MED ORDER — ACETAMINOPHEN 325 MG PO TABS
650.0000 mg | ORAL_TABLET | Freq: Four times a day (QID) | ORAL | Status: DC | PRN
  Administered 2018-04-09: 650 mg via ORAL
  Filled 2018-04-09: qty 2

## 2018-04-09 MED ORDER — POTASSIUM CHLORIDE CRYS ER 20 MEQ PO TBCR
20.0000 meq | EXTENDED_RELEASE_TABLET | Freq: Once | ORAL | Status: AC
  Administered 2018-04-09: 20 meq via ORAL
  Filled 2018-04-09: qty 1

## 2018-04-09 NOTE — Plan of Care (Signed)
  Problem: Education: Goal: Knowledge of General Education information will improve Description: Including pain rating scale, medication(s)/side effects and non-pharmacologic comfort measures Outcome: Progressing   Problem: Health Behavior/Discharge Planning: Goal: Ability to manage health-related needs will improve Outcome: Progressing   Problem: Safety: Goal: Ability to remain free from injury will improve Outcome: Progressing   

## 2018-04-09 NOTE — Progress Notes (Signed)
[place] [facility] [place] NAME: Megan Rodgers    MR#:  [id number]  DATE OF BIRTH:  August 28, 1928  SUBJECTIVE:  Patient complaining of left ankle pain this morning. Says that her legs still hurt. No other concerns today.  REVIEW OF SYSTEMS:  Review of Systems  Constitutional: Positive for malaise/fatigue. Negative for chills and fever.  HENT: Negative for congestion and sore throat.   Eyes: Negative for blurred vision and double vision.  Respiratory: Negative for cough and shortness of breath.   Cardiovascular: Negative for chest pain, palpitations and leg swelling.  Gastrointestinal: Negative for abdominal pain, nausea and vomiting.  Genitourinary: Negative for dysuria and frequency.  Musculoskeletal: Positive for joint pain.  Neurological: Positive for weakness. Negative for dizziness and headaches.  Psychiatric/Behavioral: Negative for depression. The patient is not nervous/anxious.     DRUG ALLERGIES:   Allergies  Allergen Reactions  . Penicillins Anaphylaxis    Has patient had a PCN reaction causing immediate rash, facial/tongue/throat swelling, SOB or lightheadedness with hypotension: Yes Has patient had a PCN reaction causing severe rash involving mucus membranes or skin necrosis: No Has patient had a PCN reaction that required hospitalization: No Has patient had a PCN reaction occurring within the last 10 years: Unknown If all of the above answers are "NO", then may proceed with Cephalosporin use.   . Codeine   . Iodinated Diagnostic Agents   . Metronidazole   . Shellfish-Derived Products   . Tetracyclines & Related    VITALS:  Blood pressure (!) 140/55, pulse 75, temperature (!) 100.9 F (38.3 C), temperature source Oral, resp. rate 18, height 5\' 3"  (1.6 m), weight 51.3 kg, SpO2 95 %. PHYSICAL EXAMINATION:  Physical Exam  GENERAL:  84 y.o.-year-old patient lying in the bed with no acute distress.  EYES: Pupils equal, round,  reactive to light and accommodation. No scleral icterus. Extraocular muscles intact.  HEENT: Head atraumatic, normocephalic. Oropharynx and nasopharynx clear. Moist mucous membranes. NECK:  Supple, no jugular venous distention. No thyroid enlargement, no tenderness.  LUNGS: Normal breath sounds bilaterally, no wheezing, rales,rhonchi or crepitation. No use of accessory muscles of respiration.  CARDIOVASCULAR: RRR, S1, S2 normal. No murmurs, rubs, or gallops.  ABDOMEN: Soft, nontender, nondistended. Bowel sounds present. No organomegaly or mass.  EXTREMITIES: No pedal edema, cyanosis, or clubbing. +right ankle with some swelling around the lateral malleolus  NEUROLOGIC: Cranial nerves II through XII are intact. +global weakness. Sensation intact. Gait not checked.  PSYCHIATRIC: The patient is alert and oriented x 3.  SKIN: No obvious rash, lesion, or ulcer.  LABORATORY PANEL:  Female CBC Recent Labs  Lab 04/09/18 0424  WBC 11.2*  HGB 11.0*  HCT 33.1*  PLT 157   ------------------------------------------------------------------------------------------------------------------ Chemistries  Recent Labs  Lab 04/07/18 1839  04/09/18 0424  NA 124*   < > 126*  K 3.4*   < > 3.4*  CL 93*   < > 96*  CO2 22   < > 25  GLUCOSE 126*   < > 95  BUN 27*   < > 25*  CREATININE 0.99   < > 0.84  CALCIUM 8.4*   < > 8.0*  MG 2.1  --   --    < > = values in this interval not displayed.   RADIOLOGY:  Dg Tibia/fibula Left  Result Date: 04/08/2018 CLINICAL DATA:  Left leg pain after multiple falls. EXAM: LEFT TIBIA AND FIBULA - 2 VIEW COMPARISON:  None. FINDINGS:  Mild degenerative changes in the left knee. No evidence of acute fracture or dislocation. No focal bone lesion or bone destruction. Bone cortex appears intact. Vascular calcifications and calcified phleboliths in the soft tissues. IMPRESSION: No acute bony abnormalities. Electronically Signed   By: Lucienne Capers M.D.   On: 04/08/2018 21:12    Dg Tibia/fibula Right  Result Date: 04/08/2018 CLINICAL DATA:  Right hip pain EXAM: RIGHT TIBIA AND FIBULA - 2 VIEW COMPARISON:  None. FINDINGS: Osteopenia and atherosclerosis. No evidence of fracture or bone lesion. No acute soft tissue finding. IMPRESSION: Osteopenia and atherosclerosis. Electronically Signed   By: Monte Fantasia M.D.   On: 04/08/2018 21:10   Dg Ankle Complete Left  Result Date: 04/09/2018 CLINICAL DATA:  Left ankle pain. EXAM: LEFT ANKLE COMPLETE - 3+ VIEW COMPARISON:  12/22/2014. FINDINGS: No acute bony or joint abnormality identified. No evidence of fracture or dislocation. Peripheral vascular calcification. IMPRESSION: 1. No acute bony or joint abnormality identified. No evidence of fracture or dislocation. 2.  Peripheral vascular disease. Electronically Signed   By: Marcello Moores  Register   On: 04/09/2018 10:12   Dg Femur Min 2 Views Left  Result Date: 04/08/2018 CLINICAL DATA:  Left leg pain after multiple falls. EXAM: LEFT FEMUR 2 VIEWS COMPARISON:  None. FINDINGS: Degenerative changes in the left hip and left knee. No evidence of acute fracture or dislocation. No focal bone lesion or bone destruction. Bone cortex appears intact. Vascular calcifications. IMPRESSION: Degenerative changes in the left hip and left knee. No acute bony abnormalities. Electronically Signed   By: Lucienne Capers M.D.   On: 04/08/2018 21:11   Dg Femur, Min 2 Views Right  Result Date: 04/08/2018 CLINICAL DATA:  Right leg pain. Multiple falls. EXAM: RIGHT FEMUR 2 VIEWS COMPARISON:  None. FINDINGS: Right femur appears intact. No evidence of acute fracture or dislocation. No focal bone lesions or bone destruction. Mild degenerative changes in the hip and knee joints. Soft tissues are unremarkable. IMPRESSION: No acute bony abnormalities. Electronically Signed   By: Lucienne Capers M.D.   On: 04/08/2018 21:10   ASSESSMENT AND PLAN:   Acute hyponatremia- has not improved today. Na 124 > 127 > 126.  Likely secondary to UTI and HCTZ. - HCTZ has been discontinued - continue IVFs - check urine sodium and urine osmolality  Hypothyroidism- TSH low at 0.236, T4 normal - continue synthroid - needs thyroid studies rechecked as outpatient  UTI- urine culture with multiple species - initially on IV levaquin, switched to keflex today  Bilateral leg pain- due to frequent falls. Patient with right hip bruise and some swelling around the left lateral malleolus. - x-rays of lower extremities are negative - PT consult- recommended SNF, awaiting bed placement  Chronic atrial fibrillation- rate controlled. Patient has pacemaker.  - continue propranolol and pradaxa  Hypokalemia- K 3.4, mag normal - replete  All the records are reviewed and case discussed with Care Management/Social Worker. Management plans discussed with the patient, family and they are in agreement.  CODE STATUS: DNR  TOTAL TIME TAKING CARE OF THIS PATIENT: 35 minutes.   More than 50% of the time was spent in counseling/coordination of care: YES  POSSIBLE D/C tomorrow, DEPENDING ON CLINICAL CONDITION.   Berna Spare Javanni Maring M.D on 04/09/2018 at 4:43 PM  Between 7am to 6pm - Pager - [phone]  After 6pm go to [email or web address] - [employer] [facility]  Office  [phone]  CC: Primary care physician; [place]  Note: This dictation was prepared with Dragon dictation along with smaller phrase technology. Any transcriptional errors that result from this process are unintentional.

## 2018-04-09 NOTE — NC FL2 (Signed)
[place] LEVEL OF CARE SCREENING TOOL     IDENTIFICATION  Patient Name: Megan Rodgers Birthdate: 1928/11/08 Sex: female Admission Date (Current Location): 04/07/2018  [place] and [place] Number:  Engineering geologist and Address:  [facility], [address] [place], [place] [address]      Provider Number: [id number]  Attending Physician Name and Address:  Sela Hua, MD  Relative Name and Phone Number:  [name] Daughter   [phone] or [name] Daughter   [phone] or     Current Level of Care: Hospital Recommended Level of Care: [place] Prior Approval Number:    Date Approved/Denied:   PASRR Number: [id number]  Discharge Plan: SNF    Current Diagnoses: Patient Active Problem List   Diagnosis Date Noted  . Protein-calorie malnutrition, severe 04/08/2018  . Hyponatremia 04/07/2018  . Fall at home, initial encounter 04/07/2018  . Pain in joint involving ankle and foot 08/20/2017  . Essential hypertension 08/20/2017    Orientation RESPIRATION BLADDER Height & Weight     Self, Time, Situation, Place  Normal Continent Weight: 113 lb (51.3 kg)(weight taken on sizewise bed) Height:  5\' 3"  (160 cm)  BEHAVIORAL SYMPTOMS/MOOD NEUROLOGICAL BOWEL NUTRITION STATUS      Continent Diet(Cardiac diet)  AMBULATORY STATUS COMMUNICATION OF NEEDS Skin   Limited Assist Verbally Normal                       Personal Care Assistance Level of Assistance  Bathing, Feeding, Dressing Bathing Assistance: Limited assistance Feeding assistance: Independent Dressing Assistance: Limited assistance     Functional Limitations Info  Sight, Hearing, Speech Sight Info: Adequate Hearing Info: Adequate Speech Info: Adequate    SPECIAL CARE FACTORS FREQUENCY  PT (By licensed PT), OT (By licensed OT)     PT Frequency: 5x a week OT Frequency: 5x a week            Contractures Contractures Info:  Not present    Additional Factors Info  Code Status, Allergies, Psychotropic Code Status Info: DNR Allergies Info: PENICILLINS, CODEINE, IODINATED DIAGNOSTIC AGENTS, METRONIDAZOLE, SHELLFISH-DERIVED PRODUCTS, TETRACYCLINES & RELATED  Psychotropic Info: propranolol (INDERAL) tablet 60 mg          Current Medications (04/09/2018):  This is the current hospital active medication list Current Facility-Administered Medications  Medication Dose Route Frequency Provider Last Rate Last Dose  . 0.9 %  sodium chloride infusion   Intravenous Continuous Mayo, Pete Pelt, MD 100 mL/hr at 04/09/18 0910    . cephALEXin (KEFLEX) capsule 500 mg  500 mg Oral Q12H Mayo, Pete Pelt, MD   500 mg at 04/09/18 0904  . dabigatran (PRADAXA) capsule 150 mg  150 mg Oral BID Vaughan Basta, MD   150 mg at 04/09/18 0752  . docusate sodium (COLACE) capsule 100 mg  100 mg Oral BID PRN Vaughan Basta, MD   100 mg at 04/09/18 0904  . estradiol (ESTRACE) vaginal cream 1 Applicatorful  1 Applicatorful Vaginal QHS Vaughan Basta, MD   1 Applicatorful at 00/17/49 2128  . fluticasone (FLONASE) 50 MCG/ACT nasal spray 1 spray  1 spray Each Nare Daily Vaughan Basta, MD   1 spray at 04/09/18 0911  . Influenza vac split quadrivalent PF (FLUZONE HIGH-DOSE) injection 0.5 mL  0.5 mL Intramuscular Tomorrow-1000 Vaughan Basta, MD      . levothyroxine (SYNTHROID, LEVOTHROID) tablet 75 mcg  75 mcg Oral QAC breakfast Vaughan Basta, MD  75 mcg at 04/09/18 0752  . multivitamin with minerals tablet 1 tablet  1 tablet Oral Daily Vaughan Basta, MD   1 tablet at 04/09/18 0904  . naphazoline-glycerin (CLEAR EYES REDNESS) ophth solution 2 drop  2 drop Both Eyes QID PRN Vaughan Basta, MD      . oxyCODONE-acetaminophen (PERCOCET/ROXICET) 5-325 MG per tablet 1-2 tablet  1-2 tablet Oral Q4H PRN Mayo, Pete Pelt, MD   1 tablet at 04/09/18 0033  . potassium chloride SA (K-DUR,KLOR-CON) CR  tablet 20 mEq  20 mEq Oral BID Vaughan Basta, MD   20 mEq at 04/09/18 0904  . potassium chloride SA (K-DUR,KLOR-CON) CR tablet 20 mEq  20 mEq Oral Once Mayo, Pete Pelt, MD      . primidone (MYSOLINE) tablet 50 mg  50 mg Oral TID Vaughan Basta, MD   50 mg at 04/09/18 0752  . propranolol (INDERAL) tablet 60 mg  60 mg Oral Daily Vaughan Basta, MD   60 mg at 04/09/18 2831     Discharge Medications: Please see discharge summary for a list of discharge medications.  Relevant Imaging Results:  Relevant Lab Results:   Additional Information SSN [id number]  Ross Ludwig, [place]

## 2018-04-09 NOTE — Clinical Social Work Note (Addendum)
CSW spoke with patient's daughter [name], [phone] and she would would like patient to go to SNF.  Patient and family prefers [facility], CSW was given permission to begin bed search in [place].  Formal assessment to follow.  5:15pm  CSW presented bed offers to patient and her daughter Juliann Pulse and they have accepted offer from [facility].  CSW spoke to [facility] and they can accept patient once she is medically ready for discharge and orders have been received.  Jones Broom. Dewy Rose, MSW, [place]

## 2018-04-09 NOTE — Progress Notes (Signed)
PHARMACY NOTE:  ANTIMICROBIAL RENAL DOSAGE ADJUSTMENT  Current antimicrobial regimen includes a mismatch between antimicrobial dosage and estimated renal function.  As per policy approved by the Pharmacy & Therapeutics and Medical Executive Committees, the antimicrobial dosage will be adjusted accordingly.  Current antimicrobial dosage:  Cephalexin 500 mg po q8h  Indication: UTI  Renal Function:  Estimated Creatinine Clearance: 38.2 mL/min (by C-G formula based on SCr of 0.84 mg/dL).    Antimicrobial dosage has been changed to:   Cephalexin 500mg  po q12h  For Crcl 30- 59 ml/min  Additional comments:   Thank you for allowing pharmacy to be a part of this patient's care.  Aleta Manternach A, [facility] 04/09/2018 7:39 AM

## 2018-04-09 NOTE — Plan of Care (Signed)
     Problem: Health Behavior/Discharge Planning: Goal: Ability to manage health-related needs will improve Outcome: Progressing   Problem: Activity: Goal: Risk for activity intolerance will decrease Outcome: Progressing   Problem: Safety: Goal: Ability to remain free from injury will improve Outcome: Progressing  Low bed ordered, yellow socks on, yellow bracelet on. Family at bedside.

## 2018-04-09 NOTE — Progress Notes (Signed)
Physical Therapy Treatment Patient Details Name: Megan Rodgers MRN: [id number] DOB: 15-Mar-1929 Today's Date: 04/09/2018    History of Present Illness Pt presented to ED after fall and was admitted for hyponatremia on 04/07/2018. PMH includes A-fib, skin CA, HTN, thyroid disease, tremors of nervous system. Imaging showed an interstitial edema pattern consistent with CHF. Per pt has frequent falls, stated 3 falls this week.    PT Comments    Pt is progressing toward goals. Pt has increased tolerance for ther-ex performing more repetitions and more exercises. Pt demonstrates improved bed mobility able to independently roll. Coming to sit at EOB still requires Mod A +2 physical to manage trunk and LE, but lowering into bed she can manage LE independently. Pt also progressed to standing, was able to perform pre-gait activity shifting weight laterally and lifting LEs, requiring Mod A +2 physical. Will continue to progress pt with mobility/strength/balance as able.   Follow Up Recommendations  SNF     Equipment Recommendations  Rolling walker with 5" wheels    Recommendations for Other Services       Precautions / Restrictions Precautions Precautions: Fall Restrictions Weight Bearing Restrictions: No    Mobility  Bed Mobility Overal bed mobility: Needs Assistance Bed Mobility: Supine to Sit;Sit to Supine     Supine to sit: Mod assist;+2 for physical assistance Sit to supine: Mod assist;+2 for physical assistance   General bed mobility comments: Pt demonstrates ability to disassociate trunk reaching over toward handle of bed (R side). Requires physical assist to manage LE off EOB and assist to control trunk, can manage elevating LEs into bed. No dizziness at EOB.  Transfers Overall transfer level: Needs assistance Equipment used: Rolling walker (2 wheeled) Transfers: Sit to/from Stand Sit to Stand: Mod assist;+2 physical assistance;From elevated surface         General  transfer comment: Pt has difficulty with anterior translation. Slightly unsteady t/o movement. Required VC for sequencing.  Ambulation/Gait             General Gait Details: Unable to progress this date d/t pt fatigue and LE weakness. Will attempt next treat.   Stairs             Wheelchair Mobility    Modified Rankin (Stroke Patients Only)       Balance Overall balance assessment: Needs assistance Sitting-balance support: Bilateral upper extremity supported;Feet supported Sitting balance-Leahy Scale: Fair Sitting balance - Comments: Pt maintains upright posturing independently for > 2 minutes with hand on EOB or handles for UE support. Tolerates minimal weight shift without LOB.   Standing balance support: Bilateral upper extremity supported;During functional activity Standing balance-Leahy Scale: Poor Standing balance comment: Pt unsteady on feet relies heavy on RW for UE support and Min-Mod A +2 to maintain standing.                            Cognition Arousal/Alertness: Awake/alert Behavior During Therapy: WFL for tasks assessed/performed Overall Cognitive Status: Within Functional Limits for tasks assessed                                 General Comments: Pt follows simple commands consistantly      Exercises Other Exercises Other Exercises: Supine AROM ankle pumps and AAROM CGA heel slides, hip ABD/ADD and SLRs. All ther-ex performed 10 reps with VC for sequencing. Other Exercises: Toileting activities using  bed pan. Pt able to independently roll onto R side and hold herself on her side with BUE on bed handle for >30 sec.,    General Comments        Pertinent Vitals/Pain Pain Assessment: 0-10 Pain Score: 4  Pain Location: BLE L>R Pain Descriptors / Indicators: Guarding;Constant;Sore Pain Intervention(s): Limited activity within patient's tolerance;Monitored during session;Repositioned    Home Living                       Prior Function            PT Goals (current goals can now be found in the care plan section) Acute Rehab PT Goals Patient Stated Goal: To be independent with ADL and IADL again. PT Goal Formulation: With patient Time For Goal Achievement: 04/22/18 Potential to Achieve Goals: Fair Progress towards PT goals: Progressing toward goals    Frequency    Min 2X/week      PT Plan Current plan remains appropriate    Co-evaluation              AM-PAC PT "6 Clicks" Daily Activity  Outcome Measure  Difficulty turning over in bed (including adjusting bedclothes, sheets and blankets)?: A Little Difficulty moving from lying on back to sitting on the side of the bed? : Unable Difficulty sitting down on and standing up from a chair with arms (e.g., wheelchair, bedside commode, etc,.)?: Unable Help needed moving to and from a bed to chair (including a wheelchair)?: Total Help needed walking in hospital room?: Total Help needed climbing 3-5 steps with a railing? : Total 6 Click Score: 8    End of Session Equipment Utilized During Treatment: Gait belt Activity Tolerance: Patient tolerated treatment well Patient left: in bed;with call bell/phone within reach;with bed alarm set;with family/visitor present Nurse Communication: Mobility status PT Visit Diagnosis: Unsteadiness on feet (R26.81);Muscle weakness (generalized) (M62.81);Pain Pain - Right/Left: (Both) Pain - part of body: Knee;Leg;Ankle and joints of foot     Time: 1610-9604 PT Time Calculation (min) (ACUTE ONLY): 40 min  Charges:                        Algis Downs, SPT 04/09/2018, 5:04 PM

## 2018-04-09 NOTE — Evaluation (Addendum)
Clinical/Bedside Swallow Evaluation Patient Details  Name: Megan Rodgers MRN: [id number] Date of Birth: Oct 26, 1928  Today's Date: 04/09/2018 Time: SLP Start Time (ACUTE ONLY): 1645 SLP Stop Time (ACUTE ONLY): 1745 SLP Time Calculation (min) (ACUTE ONLY): 60 min  Past Medical History:  Past Medical History:  Diagnosis Date  . Atrial fibrillation ([place])   . Cancer ([place])    skin ca  . Hypertension   . Thyroid disease   . Tremors of nervous system    Past Surgical History:  Past Surgical History:  Procedure Laterality Date  . ABDOMINAL HYSTERECTOMY    . BREAST CYST ASPIRATION Left    lt fna-neg  . CARDIAC ELECTROPHYSIOLOGY STUDY AND ABLATION    . PACEMAKER INSERTION    . TONSILLECTOMY     HPI:   Pt is a 84 y.o. female with a known history of atrial fibrillation, skin cancer, hypertension, thyroid disease, tremors of the nervous system who had symptoms of UTI and received some antibiotic by primary care physician which she took for last 2 days. CXR revealed: Interstitial edema pattern consistent with congestive heart failure.   Assessment / Plan / Recommendation Clinical Impression  Pt appears to present w/ adequate oropharyngeal phase swallowing function w/ reduced risk for aspiration when following general aspiration precautions. Pt does need support w/ tray setup and positioning more upright in bed for oral intake. Pt consumed po trials assessed during her Dinner meal(mixed consistency soup, crackers, applesauce, water via Straw) w/ no immediate, overt s/s of aspiration noted. No wet vocal quality or decline in respiratory status was noted during/post trials; no coughing w/ thin liquids via standard straw(Larger jug straw removed). Pt's oral phase was grossly [facility] w/ po trials c/b adequate oral phase time for mastication of increased textured food trials; cohesive bolus mangement for A-P transfer. Food trials were moistened and cut small. Pt exhibited appropriate oral clearing w/ all  foods/liquids. OM exam revealed no unilateral weakness w/ lingual/labial movements; speech was clear. Recommend continue a Regular diet w/ meats/foods cut small, moistened well; Thin liquids via Cup/Straw. General aspiration precautions including Small, Single sips/bites. Recommend Pills WHOLE in puree for safer swallowing - Nursing to teach pt this strategy for home use as needed. Of note, pt stated her difficulty swallowing a Pill centered around a Large Pill that Nursing Cut in Half which then caused "a scratching" of her throat - this may have lead to her c/o a "burning", "scratching" feeling "sometimes right here" as she pointed to her sternal notch area. Pt did not c/o this discomfort while eating her Dinner meal w/ SLP this evaluation. Informed NSG while will now offer Pills WHOLE in Applesauce or ice cream for easier swallowing, and teach for home use. No further skilled ST services indicated at this time as pt appears at her baseline w/ swallowing. SLP Visit Diagnosis: (w/ Pills)    Aspiration Risk  (reduced following general precautions)    Diet Recommendation  regular diet w/ meats Cut and foods moistened; thin liquids. General aspiration precautions and tray setup support d/t UE tremors.   Medication Administration: Whole meds with puree(for safer swallowing)    Other  Recommendations Recommended Consults: (Dietician f/u; Ot consult for weighted utensils) Oral Care Recommendations: Oral care BID;Patient independent with oral care Other Recommendations: (n/a)   Follow up Recommendations None      Frequency and Duration (n/a)  (n/a)       Prognosis Prognosis for Safe Diet Advancement: Good Barriers to Reach Goals: (baseline UE  tremors impacting self feeding)      Swallow Study   General Date of Onset: 04/07/18 Type of Study: Bedside Swallow Evaluation Previous Swallow Assessment: none reported Diet Prior to this Study: Regular;Thin liquids Temperature Spikes Noted: (wbc  11.2; temp 100.9) Respiratory Status: Room air History of Recent Intubation: No Behavior/Cognition: Alert;Cooperative;Pleasant mood;Distractible;Requires cueing Oral Cavity Assessment: Within Functional Limits Oral Care Completed by SLP: Recent completion by staff Oral Cavity - Dentition: Adequate natural dentition Vision: Functional for self-feeding Self-Feeding Abilities: Able to feed self;Needs assist;Needs set up(UE tremors baseline) Patient Positioning: Upright in bed(needed positioning) Baseline Vocal Quality: Normal Volitional Cough: Strong Volitional Swallow: Able to elicit    Oral/Motor/Sensory Function Overall Oral Motor/Sensory Function: Within functional limits   Ice Chips Ice chips: Not tested   Thin Liquid Thin Liquid: Within functional limits Presentation: Self Fed;Straw(~4 ozs total) Other Comments: then ~6 ozs of Ensure    Nectar Thick Nectar Thick Liquid: Not tested   Honey Thick Honey Thick Liquid: Not tested   Puree Puree: Within functional limits Presentation: Self Fed;Spoon(6+ trials)   Solid     Solid: Within functional limits(mech soft trials) Presentation: Self Fed;Spoon(~4-5 ozs of chicken/veg soup w/ crackers)      Orinda Kenner, MS, CCC-SLP Watson,Katherine 04/09/2018,6:37 PM

## 2018-04-10 LAB — CBC
HEMATOCRIT: 32.3 % — AB (ref 36.0–46.0)
Hemoglobin: 10.8 g/dL — ABNORMAL LOW (ref 12.0–15.0)
MCH: 31.2 pg (ref 26.0–34.0)
MCHC: 33.4 g/dL (ref 30.0–36.0)
MCV: 93.4 fL (ref 80.0–100.0)
NRBC: 0 % (ref 0.0–0.2)
PLATELETS: 168 10*3/uL (ref 150–400)
RBC: 3.46 MIL/uL — ABNORMAL LOW (ref 3.87–5.11)
RDW: 13.2 % (ref 11.5–15.5)
WBC: 8.8 10*3/uL (ref 4.0–10.5)

## 2018-04-10 LAB — BASIC METABOLIC PANEL
ANION GAP: 5 (ref 5–15)
BUN: 19 mg/dL (ref 8–23)
CALCIUM: 8 mg/dL — AB (ref 8.9–10.3)
CO2: 21 mmol/L — AB (ref 22–32)
CREATININE: 0.85 mg/dL (ref 0.44–1.00)
Chloride: 103 mmol/L (ref 98–111)
GFR calc Af Amer: >60 mL/min (ref >60)
GFR, EST NON AFRICAN AMERICAN: 60 mL/min — AB (ref >60)
Glucose, Bld: 117 mg/dL — ABNORMAL HIGH (ref 70–99)
Potassium: 4.7 mmol/L (ref 3.5–5.1)
Sodium: 129 mmol/L — ABNORMAL LOW (ref 135–145)

## 2018-04-10 LAB — T3: T3 TOTAL: 42 ng/dL — AB (ref 71–180)

## 2018-04-10 LAB — OSMOLALITY, URINE: Osmolality, Ur: 299 mOsm/kg — ABNORMAL LOW (ref 300–900)

## 2018-04-10 LAB — SODIUM, URINE, RANDOM: Sodium, Ur: <10 mmol/L

## 2018-04-10 MED ORDER — BISACODYL 10 MG RE SUPP
10.0000 mg | Freq: Once | RECTAL | Status: AC
  Administered 2018-04-10: 10 mg via RECTAL
  Filled 2018-04-10: qty 1

## 2018-04-10 MED ORDER — CEPHALEXIN 500 MG PO CAPS: 500.0000 mg | ORAL_CAPSULE | Freq: Two times a day (BID) | ORAL | 0 refills | Status: DC

## 2018-04-10 MED ORDER — VITAMIN D-3 25 MCG (1000 UT) PO CAPS: 1.0000 | ORAL_CAPSULE | Freq: Every day | ORAL | Status: AC

## 2018-04-10 NOTE — Clinical Social Work Placement (Signed)
   CLINICAL SOCIAL WORK PLACEMENT  NOTE  Date:  04/10/2018  Patient Details  Name: Megan Rodgers MRN: [id number] Date of Birth: [birth year]  Clinical Social Work is seeking post-discharge placement for this patient at the [place] level of care (*CSW will initial, date and re-position this form in  chart as items are completed):  Yes   Patient/family provided with [place] Work Department's list of facilities offering this level of care within the geographic area requested by the patient (or if unable, by the patient's family).  Yes   Patient/family informed of their freedom to choose among providers that offer the needed level of care, that participate in Medicare, Medicaid or managed care program needed by the patient, have an available bed and are willing to accept the patient.  Yes   Patient/family informed of [place]'s ownership interest in [facility] and [facility], as well as of the fact that they are under no obligation to receive care at these facilities.  PASRR submitted to EDS on 04/09/18     PASRR number received on 04/09/18     Existing PASRR number confirmed on       FL2 transmitted to all facilities in geographic area requested by pt/family on 04/09/18     FL2 transmitted to all facilities within larger geographic area on       Patient informed that his/her managed care company has contracts with or will negotiate with certain facilities, including the following:        Yes   Patient/family informed of bed offers received.  Patient chooses bed at [facility]     Physician recommends and patient chooses bed at      Patient to be transferred to [facility] [place] on 04/10/18.  Patient to be transferred to facility by [facility]     Patient family notified on 04/10/18 of transfer.  Name of family member notified:  [name] Daughter   [phone]      PHYSICIAN Please sign FL2      Additional Comment:    _______________________________________________ Ross Ludwig, LCSWA 04/10/2018, 3:22 PM

## 2018-04-10 NOTE — Discharge Summary (Signed)
[place] [facility] [place] NAME: Megan Rodgers    MR#:  [id number]  DATE OF BIRTH:  [birth year]  DATE OF ADMISSION:  04/07/2018 ADMITTING PHYSICIAN: Vaughan Basta, MD  DATE OF DISCHARGE: 04/10/2018  PRIMARY CARE PHYSICIAN: [place]   ADMISSION DIAGNOSIS:  Acute hyponatremia [E87.1] Acute cystitis with hematuria [N30.01] Fall, initial encounter [W19.XXXA] Acute pain of right shoulder [M25.511]  DISCHARGE DIAGNOSIS:  Principal Problem:   Hyponatremia Active Problems:   Fall at home, initial encounter   Protein-calorie malnutrition, severe   SECONDARY DIAGNOSIS:   Past Medical History:  Diagnosis Date  . Atrial fibrillation (Megan Rodgers)   . Cancer ([place])    skin ca  . Hypertension   . Thyroid disease   . Tremors of nervous system      ADMITTING HISTORY  HISTORY OF PRESENT ILLNESS: Megan Rodgers  is a 84 y.o. female with a known history of atrial fibrillation, skin cancer, hypertension, thyroid disease, tremors-had symptoms of UTI and received some antibiotic by primary care physician which she took for last 2 days. She denies having fever or vomiting. She started having significant weakness and dizziness on trying to stand up and had multiple falls in the last few days. Concerned with this she came to emergency room.  Noted to have hyponatremia and so given to hospitalist team for further management.  HOSPITAL COURSE:   Acute hyponatremia- has improved to 129. She does have chronic mild hyponatremia ranging from 129 - 134. Will need repeat BMP in 3-4 days after discharge HCTZ stopped.   Hypothyroidism- TSH low at 0.236 - continue synthroid - needs thyroid studies rechecked as outpatient  UTI- urine culture with multiple species - initially on IV levaquin, switched to keflex and continue for 4 more days  Bilateral leg pain- due to frequent falls. Patient with right hip bruise and some swelling  around the left lateral malleolus. - x-rays of lower extremities are negative - PT consult- recommended SNF D/C to SNF today  Chronic atrial fibrillation- rate controlled. Patient has pacemaker.  - continue propranolol andpradaxa  Hypokalemia- replaced  Stable for discharge to SNF  CONSULTS OBTAINED:    DRUG ALLERGIES:   Allergies  Allergen Reactions  . Penicillins Anaphylaxis    Has patient had a PCN reaction causing immediate rash, facial/tongue/throat swelling, SOB or lightheadedness with hypotension: Yes Has patient had a PCN reaction causing severe rash involving mucus membranes or skin necrosis: No Has patient had a PCN reaction that required hospitalization: No Has patient had a PCN reaction occurring within the last 10 years: Unknown If all of the above answers are "NO", then may proceed with Cephalosporin use.   . Codeine   . Iodinated Diagnostic Agents   . Metronidazole   . Shellfish-Derived Products   . Tetracyclines & Related     DISCHARGE MEDICATIONS:   Allergies as of 04/10/2018      Reactions   Penicillins Anaphylaxis   Has patient had a PCN reaction causing immediate rash, facial/tongue/throat swelling, SOB or lightheadedness with hypotension: Yes Has patient had a PCN reaction causing severe rash involving mucus membranes or skin necrosis: No Has patient had a PCN reaction that required hospitalization: No Has patient had a PCN reaction occurring within the last 10 years: Unknown If all of the above answers are "NO", then may proceed with Cephalosporin use.   Codeine    Iodinated Diagnostic Agents    Metronidazole    Shellfish-derived Products  Tetracyclines & Related       Medication List    STOP taking these medications   hydrochlorothiazide 12.5 MG tablet Commonly known as:  HYDRODIURIL   nitrofurantoin (macrocrystal-monohydrate) 100 MG capsule Commonly known as:  MACROBID   traMADol 50 MG tablet Commonly known as:  ULTRAM      TAKE these medications   cephALEXin 500 MG capsule Commonly known as:  KEFLEX Take 1 capsule (500 mg total) by mouth every 12 (twelve) hours.   citalopram 10 MG tablet Commonly known as:  CELEXA Take 10 mg by mouth daily.   dabigatran 150 MG Caps capsule Commonly known as:  PRADAXA Take 150 mg by mouth 2 (two) times daily.   estradiol 0.1 MG/GM vaginal cream Commonly known as:  ESTRACE Place 1 Applicatorful vaginally at bedtime.   fluticasone 50 MCG/ACT nasal spray Commonly known as:  FLONASE Place into both nostrils daily.   LACTOBACILLUS PO Take by mouth.   levothyroxine 50 MCG tablet Commonly known as:  SYNTHROID, LEVOTHROID Take 75 mcg by mouth daily before breakfast.   multivitamin capsule Take 1 capsule by mouth daily.   naphazoline-glycerin 0.012-0.2 % Soln Commonly known as:  CLEAR EYES REDNESS Apply to eye.   primidone 50 MG tablet Commonly known as:  MYSOLINE Take 50 mg by mouth 3 (three) times daily.   propranolol 60 MG tablet Commonly known as:  INDERAL Take 60 mg by mouth daily.   Vitamin D-3 1000 units Caps Take 1 capsule (1,000 Units total) by mouth daily. What changed:    medication strength  how much to take  when to take this       Today   VITAL SIGNS:  Blood pressure (!) 114/48, pulse 74, temperature 98.5 F (36.9 C), temperature source Oral, resp. rate 18, height 5\' 3"  (1.6 m), weight 51.3 kg, SpO2 94 %.  I/O:    Intake/Output Summary (Last 24 hours) at 04/10/2018 1043 Last data filed at 04/10/2018 5621 Gross per 24 hour  Intake 1880.54 ml  Output 800 ml  Net 1080.54 ml    PHYSICAL EXAMINATION:  Physical Exam  GENERAL:  84 y.o.-year-old patient lying in the bed with no acute distress.  LUNGS: Normal breath sounds bilaterally, no wheezing, rales,rhonchi or crepitation. No use of accessory muscles of respiration.  CARDIOVASCULAR: S1, S2 normal. No murmurs, rubs, or gallops.  ABDOMEN: Soft, non-tender, non-distended.  Bowel sounds present. No organomegaly or mass.  NEUROLOGIC: Moves all 4 extremities. PSYCHIATRIC: The patient is alert and oriented x 3.  SKIN: No obvious rash, lesion, or ulcer.   DATA REVIEW:   CBC Recent Labs  Lab 04/10/18 0554  WBC 8.8  HGB 10.8*  HCT 32.3*  PLT 168    Chemistries  Recent Labs  Lab 04/07/18 1839  04/10/18 0554  NA 124*   < > 129*  K 3.4*   < > 4.7  CL 93*   < > 103  CO2 22   < > 21*  GLUCOSE 126*   < > 117*  BUN 27*   < > 19  CREATININE 0.99   < > 0.85  CALCIUM 8.4*   < > 8.0*  MG 2.1  --   --    < > = values in this interval not displayed.    Cardiac Enzymes Recent Labs  Lab 04/07/18 1839  TROPONINI 0.04*    Microbiology Results  Results for orders placed or performed during the hospital encounter of 04/07/18  Urine Culture  Status: Abnormal   Collection Time: 04/07/18  8:05 PM  Result Value Ref Range Status   Specimen Description   Final    URINE, RANDOM Performed at [facility], [address] [place], [place] [address]    Special Requests   Final    NONE Performed at [facility], [address] [place], [place] [address]    Culture MULTIPLE SPECIES PRESENT, SUGGEST RECOLLECTION (A)  Final   Report Status 04/09/2018 FINAL  Final    RADIOLOGY:  Dg Tibia/fibula Left  Result Date: 04/08/2018 CLINICAL DATA:  Left leg pain after multiple falls. EXAM: LEFT TIBIA AND FIBULA - 2 VIEW COMPARISON:  None. FINDINGS: Mild degenerative changes in the left knee. No evidence of acute fracture or dislocation. No focal bone lesion or bone destruction. Bone cortex appears intact. Vascular calcifications and calcified phleboliths in the soft tissues. IMPRESSION: No acute bony abnormalities. Electronically Signed   By: Lucienne Capers M.D.   On: 04/08/2018 21:12   Dg Tibia/fibula Right  Result Date: 04/08/2018 CLINICAL DATA:  Right hip pain EXAM: RIGHT TIBIA AND FIBULA - 2 VIEW COMPARISON:  None. FINDINGS: Osteopenia  and atherosclerosis. No evidence of fracture or bone lesion. No acute soft tissue finding. IMPRESSION: Osteopenia and atherosclerosis. Electronically Signed   By: Monte Fantasia M.D.   On: 04/08/2018 21:10   Dg Ankle Complete Left  Result Date: 04/09/2018 CLINICAL DATA:  Left ankle pain. EXAM: LEFT ANKLE COMPLETE - 3+ VIEW COMPARISON:  12/22/2014. FINDINGS: No acute bony or joint abnormality identified. No evidence of fracture or dislocation. Peripheral vascular calcification. IMPRESSION: 1. No acute bony or joint abnormality identified. No evidence of fracture or dislocation. 2.  Peripheral vascular disease. Electronically Signed   By: Marcello Moores  Register   On: 04/09/2018 10:12   Dg Femur Min 2 Views Left  Result Date: 04/08/2018 CLINICAL DATA:  Left leg pain after multiple falls. EXAM: LEFT FEMUR 2 VIEWS COMPARISON:  None. FINDINGS: Degenerative changes in the left hip and left knee. No evidence of acute fracture or dislocation. No focal bone lesion or bone destruction. Bone cortex appears intact. Vascular calcifications. IMPRESSION: Degenerative changes in the left hip and left knee. No acute bony abnormalities. Electronically Signed   By: Lucienne Capers M.D.   On: 04/08/2018 21:11   Dg Femur, Min 2 Views Right  Result Date: 04/08/2018 CLINICAL DATA:  Right leg pain. Multiple falls. EXAM: RIGHT FEMUR 2 VIEWS COMPARISON:  None. FINDINGS: Right femur appears intact. No evidence of acute fracture or dislocation. No focal bone lesions or bone destruction. Mild degenerative changes in the hip and knee joints. Soft tissues are unremarkable. IMPRESSION: No acute bony abnormalities. Electronically Signed   By: Lucienne Capers M.D.   On: 04/08/2018 21:10    Follow up with PCP in 1 week.  Management plans discussed with the patient, family and they are in agreement.  CODE STATUS:     Code Status Orders  (From admission, onward)         Start     Ordered   04/07/18 2232  Do not attempt  resuscitation (DNR)  Continuous    Question Answer Comment  In the event of cardiac or respiratory ARREST Do not call a "code blue"   In the event of cardiac or respiratory ARREST Do not perform Intubation, CPR, defibrillation or ACLS   In the event of cardiac or respiratory ARREST Use medication by any route, position, wound care, and other measures to relive pain  and suffering. May use oxygen, suction and manual treatment of airway obstruction as needed for comfort.      04/07/18 2232        Code Status History    This patient has a current code status but no historical code status.      TOTAL TIME TAKING CARE OF THIS PATIENT ON DAY OF DISCHARGE: more than 30 minutes.   Leia Alf Gerrie Castiglia M.D on 04/10/2018 at 10:43 AM  Between 7am to 6pm - Pager - [phone]  After 6pm go to [email or web address] - password [email or web address] [facility]  Office  [phone]  CC: Primary care physician; [facility]  Note: This dictation was prepared with Dragon dictation along with smaller phrase technology. Any transcriptional errors that result from this process are unintentional.

## 2018-04-10 NOTE — Care Management Important Message (Signed)
Copy of signed IM left with patient in room.  

## 2018-04-10 NOTE — Discharge Instructions (Signed)
Diet recommended at Discharge:  Regular diet w/ Meats Cut and foods Moistened; thin liquids. General aspiration precautions and tray setup support d/t UE tremors. Pills given WHOLE in Puree for safer swallowing.

## 2018-04-10 NOTE — Clinical Social Work Note (Signed)
Patient to be d/c'ed today to [facility] room 408.  Patient and family agreeable to plans will transport via ems RN to call report [phone].  CSW spoke to patient's daughter Juliann Pulse who is aware that patient will be discharging today.   Evette Cristal, MSW, [place]

## 2018-04-10 NOTE — Clinical Social Work Note (Addendum)
Clinical Social Work Assessment  Patient Details  Name: Megan Rodgers MRN: [id number] Date of Birth: 1929/04/01  Date of referral:  04/10/18               Reason for consult:  Facility Placement                Permission sought to share information with:  Family Supports, Customer service manager Permission granted to share information::  Yes, Verbal Permission Granted  Name::     [name] Daughter   [phone] [name] Daughter   [phone]   Agency::  SNF admissions  Relationship::     Contact Information:     Housing/Transportation Living arrangements for the past 2 months:  Single Family Home Source of Information:  Patient, Adult Children Patient Interpreter Needed:  None Criminal Activity/Legal Involvement Pertinent to Current Situation/Hospitalization:  No - Comment as needed Significant Relationships:  Adult Children Lives with:  Self Do you feel safe going back to the place where you live?  No Need for family participation in patient care:  Yes (Comment)  Care giving concerns: Patient and family feels she needs some short term rehab before she is able to return back home.   Social Worker assessment / plan:  Patient is an 84 year old female who lives alone she is alert and oriented x4.  Patient states she has not been to rehab at [facility] before.  CSW spoke with patient and her family to explain the process, role of CSW, and how insurance will pay for stay.  CSW informed patient and her family about what to expect at SNF and how CSW will find placement for her.  Patient stated she has heard good things about [facility] and is requesting to go the facility.  Patient and family did have any other questions or concerns and gave CSW permission to begin bed search in [place].   Employment status:  Retired Forensic scientist:  Medicare PT Recommendations:  [place] / Referral to community resources:      Patient/Family's Response to care:  Patient is agreeable to going to SNF for short term rehab.  Patient/Family's Understanding of and Emotional Response to Diagnosis, Current Treatment, and Prognosis:  Patient is hopeful that she will not have to be at rehab for very long.  Emotional Assessment Appearance:  Appears stated age Attitude/Demeanor/Rapport:    Affect (typically observed):  Appropriate, Calm Orientation:  Oriented to Place, Oriented to Self, Oriented to  Time, Oriented to Situation Alcohol / Substance use:  Not Applicable Psych involvement (Current and /or in the community):  No (Comment)  Discharge Needs  Concerns to be addressed:  Lack of Support, Care Coordination Readmission within the last 30 days:  No Current discharge risk:  Lack of support system, Lives alone Barriers to Discharge:  No Barriers Identified   Ross Ludwig, LCSWA 04/10/2018, 3:15 PM

## 2018-04-10 NOTE — Progress Notes (Signed)
Pt will be discharged to [facility]. Report called to [facility] at 1455 and EMS notified of transportation. Discharge packet is complete. DNR placed in packet. I will continue to assess.

## 2018-07-01 ENCOUNTER — Other Ambulatory Visit: Payer: Self-pay | Admitting: Unknown Physician Specialty

## 2018-07-01 DIAGNOSIS — Z780 Asymptomatic menopausal state: Secondary | ICD-10-CM

## 2018-07-13 ENCOUNTER — Other Ambulatory Visit: Payer: Self-pay | Admitting: Physician Assistant

## 2018-07-13 ENCOUNTER — Ambulatory Visit
Admission: RE | Admit: 2018-07-13 | Discharge: 2018-07-13 | Disposition: A | Discharge status: Home / Self Care | Payer: Medicare Other | Source: Ambulatory Visit | Attending: Physician Assistant | Admitting: Physician Assistant

## 2018-07-13 DIAGNOSIS — M4856XA Collapsed vertebra, not elsewhere classified, lumbar region, initial encounter for fracture: Secondary | ICD-10-CM | POA: Diagnosis not present

## 2018-09-26 ENCOUNTER — Emergency Department: Payer: Medicare Other

## 2018-09-26 ENCOUNTER — Emergency Department
Admission: EM | Admit: 2018-09-26 | Discharge: 2018-09-27 | Disposition: A | Discharge status: Home / Self Care | Payer: Medicare Other | Attending: Emergency Medicine | Admitting: Emergency Medicine

## 2018-09-26 ENCOUNTER — Other Ambulatory Visit: Payer: Self-pay

## 2018-09-26 DIAGNOSIS — I4821 Permanent atrial fibrillation: Secondary | ICD-10-CM | POA: Diagnosis not present

## 2018-09-26 DIAGNOSIS — I10 Essential (primary) hypertension: Secondary | ICD-10-CM | POA: Insufficient documentation

## 2018-09-26 DIAGNOSIS — Z950 Presence of cardiac pacemaker: Secondary | ICD-10-CM | POA: Insufficient documentation

## 2018-09-26 DIAGNOSIS — Z79899 Other long term (current) drug therapy: Secondary | ICD-10-CM | POA: Insufficient documentation

## 2018-09-26 DIAGNOSIS — R531 Weakness: Secondary | ICD-10-CM | POA: Diagnosis not present

## 2018-09-26 DIAGNOSIS — Z85828 Personal history of other malignant neoplasm of skin: Secondary | ICD-10-CM | POA: Insufficient documentation

## 2018-09-26 LAB — COMPREHENSIVE METABOLIC PANEL
ALT: 14 U/L (ref 0–44)
AST: 29 U/L (ref 15–41)
Albumin: 3.9 g/dL (ref 3.5–5.0)
Alkaline Phosphatase: 76 U/L (ref 38–126)
Anion gap: 8 (ref 5–15)
BUN: 21 mg/dL (ref 8–23)
CO2: 27 mmol/L (ref 22–32)
Calcium: 9.1 mg/dL (ref 8.9–10.3)
Chloride: 101 mmol/L (ref 98–111)
Creatinine, Ser: 0.77 mg/dL (ref 0.44–1.00)
GFR calc Af Amer: >60 mL/min (ref >60)
GFR calc non Af Amer: >60 mL/min (ref >60)
Glucose, Bld: 95 mg/dL (ref 70–99)
Potassium: 4.5 mmol/L (ref 3.5–5.1)
Sodium: 136 mmol/L (ref 135–145)
Total Bilirubin: 0.5 mg/dL (ref 0.3–1.2)
Total Protein: 6.9 g/dL (ref 6.5–8.1)

## 2018-09-26 LAB — URINALYSIS, COMPLETE (UACMP) WITH MICROSCOPIC
Bacteria, UA: NONE SEEN
Bilirubin Urine: NEGATIVE
Glucose, UA: NEGATIVE mg/dL
Hgb urine dipstick: NEGATIVE
Ketones, ur: NEGATIVE mg/dL
Leukocytes,Ua: NEGATIVE
Nitrite: NEGATIVE
Protein, ur: NEGATIVE mg/dL
Specific Gravity, Urine: 1.017 (ref 1.005–1.030)
pH: 6 (ref 5.0–8.0)

## 2018-09-26 LAB — DIFFERENTIAL
Abs Immature Granulocytes: 0.01 10*3/uL (ref 0.00–0.07)
Basophils Absolute: 0 10*3/uL (ref 0.0–0.1)
Basophils Relative: 1 %
Eosinophils Absolute: 0.1 10*3/uL (ref 0.0–0.5)
Eosinophils Relative: 2 %
Immature Granulocytes: 0 %
Lymphocytes Relative: 47 %
Lymphs Abs: 2.6 10*3/uL (ref 0.7–4.0)
Monocytes Absolute: 0.6 10*3/uL (ref 0.1–1.0)
Monocytes Relative: 11 %
Neutro Abs: 2.2 10*3/uL (ref 1.7–7.7)
Neutrophils Relative %: 39 %

## 2018-09-26 LAB — CBC
HCT: 40.8 % (ref 36.0–46.0)
Hemoglobin: 13.3 g/dL (ref 12.0–15.0)
MCH: 32 pg (ref 26.0–34.0)
MCHC: 32.6 g/dL (ref 30.0–36.0)
MCV: 98.3 fL (ref 80.0–100.0)
Platelets: 179 10*3/uL (ref 150–400)
RBC: 4.15 MIL/uL (ref 3.87–5.11)
RDW: 12.9 % (ref 11.5–15.5)
WBC: 5.5 10*3/uL (ref 4.0–10.5)
nRBC: 0 % (ref 0.0–0.2)

## 2018-09-26 LAB — ETHANOL: Alcohol, Ethyl (B): <10 mg/dL (ref <10)

## 2018-09-26 LAB — PROTIME-INR
INR: 1.2 (ref 0.8–1.2)
Prothrombin Time: 15.3 seconds — ABNORMAL HIGH (ref 11.4–15.2)

## 2018-09-26 LAB — APTT: aPTT: 33 seconds (ref 24–36)

## 2018-09-26 NOTE — Discharge Instructions (Addendum)
Your lab tests and CT scan of the brain were okay today. Your symptoms had resolved by the time you arrived in the ER.  Continue taking your home medications and follow up with your doctor this week.

## 2018-09-26 NOTE — ED Notes (Signed)
Lights dimmed for comfort. Pt waiting on family for discharge.

## 2018-09-26 NOTE — ED Notes (Signed)
Spoke with daughter regarding discharge instructions with pt's consent. Daughter informed pt will need a ride home.

## 2018-09-26 NOTE — ED Notes (Signed)
Informed patient that we will need a urine sample. Requested that patient notify staff if she needs to use the bathroom.

## 2018-09-26 NOTE — ED Provider Notes (Signed)
[facility] Emergency Department Provider Note  ____________________________________________  Time seen: Approximately 11:18 PM  I have reviewed the triage vital signs and the nursing notes.   HISTORY  Chief Complaint tia symptoms    Level 5 Caveat: Portions of the History and Physical including HPI and review of systems are unable to be completely obtained due to patient being a poor historian   HPI Megan Rodgers is a 85 y.o. female with a history of atrial fibrillation, skin cancer, hypertension, TIA, on Pradaxa, who was sent to the ED  today via EMS due to concerns for TIA.  Patient reports that she just felt generalized weakness starting at about noon which was constant until sometime during transport from home to the emergency room.  On arrival to the emergency room she reports that her symptoms have resolved and she feels normal again.  She denies any focal weakness or paresthesia.  No nausea vomiting headache or vision change.  Denies any recent illness or changes in oral intake.  Denies dysuria     Past Medical History:  Diagnosis Date  . Atrial fibrillation ([place])   . Cancer ([place])    skin ca  . Hypertension   . Thyroid disease   . Tremors of nervous system      Patient Active Problem List   Diagnosis Date Noted  . Protein-calorie malnutrition, severe 04/08/2018  . Hyponatremia 04/07/2018  . Fall at home, initial encounter 04/07/2018  . Pain in joint involving ankle and foot 08/20/2017  . Essential hypertension 08/20/2017     Past Surgical History:  Procedure Laterality Date  . ABDOMINAL HYSTERECTOMY    . BREAST CYST ASPIRATION Left    lt fna-neg  . CARDIAC ELECTROPHYSIOLOGY STUDY AND ABLATION    . PACEMAKER INSERTION    . TONSILLECTOMY       Prior to Admission medications   Medication Sig Start Date End Date Taking? Authorizing Provider  cephALEXin (KEFLEX) 500 MG capsule Take 1 capsule (500 mg total) by mouth every 12  (twelve) hours. 04/10/18   Hillary Bow, MD  Cholecalciferol (VITAMIN D-3) 1000 units CAPS Take 1 capsule (1,000 Units total) by mouth daily. 04/10/18   Hillary Bow, MD  citalopram (CELEXA) 10 MG tablet Take 10 mg by mouth daily.    [provider]  dabigatran (PRADAXA) 150 MG CAPS capsule Take 150 mg by mouth 2 (two) times daily.    [provider]  estradiol (ESTRACE) 0.1 MG/GM vaginal cream Place 1 Applicatorful vaginally at bedtime.    [provider]  fluticasone (FLONASE) 50 MCG/ACT nasal spray Place into both nostrils daily.    [provider]  LACTOBACILLUS PO Take by mouth.    [provider]  levothyroxine (SYNTHROID, LEVOTHROID) 50 MCG tablet Take 75 mcg by mouth daily before breakfast.     [provider]  Multiple Vitamin (MULTIVITAMIN) capsule Take 1 capsule by mouth daily.    [provider]  naphazoline-glycerin (CLEAR EYES REDNESS) 0.012-0.2 % SOLN Apply to eye.    [provider]  primidone (MYSOLINE) 50 MG tablet Take 50 mg by mouth 3 (three) times daily.    [provider]  propranolol (INDERAL) 60 MG tablet Take 60 mg by mouth daily.    [provider]     Allergies Penicillins; Codeine; Iodinated diagnostic agents; Metronidazole; Shellfish-derived products; and Tetracyclines & related   Family History  Problem Relation Age of Onset  . Hypertension Mother   . Breast cancer  Neg Hx     Social History Social History   Tobacco Use  . Smoking status: Never Smoker  . Smokeless tobacco: Never Used  Substance Use Topics  . Alcohol use: No  . Drug use: No    Review of Systems Level 5 Caveat: Portions of the History and Physical including HPI and review of systems are unable to be completely obtained due to patient being a poor historian   Constitutional:   No known fever.  ENT:   No rhinorrhea. Cardiovascular:   No chest pain or syncope. Respiratory:   No dyspnea or  cough. Gastrointestinal:   Negative for abdominal pain, vomiting and diarrhea.  Musculoskeletal:   Negative for focal pain or swelling ____________________________________________   PHYSICAL EXAM:  VITAL SIGNS: ED Triage Vitals [09/26/18 2025]  Enc Vitals Group     BP (!) 170/91     Pulse Rate 91     Resp 18     Temp 97.8 F (36.6 C)     Temp Source Oral     SpO2 100 %     Weight 130 lb (59 kg)     Height 5\' 5"  (1.651 m)     Head Circumference      Peak Flow      Pain Score 0     Pain Loc      Pain Edu?      Excl. in [place]?     Vital signs reviewed, nursing assessments reviewed.   Constitutional:   Alert and oriented. Non-toxic appearance. Eyes:   Conjunctivae are normal. EOMI. PERRL. ENT      Head:   Normocephalic and atraumatic.      Nose:   No congestion/rhinnorhea.       Mouth/Throat:   MMM, no pharyngeal erythema. No peritonsillar mass.       Neck:   No meningismus. Full ROM. Hematological/Lymphatic/Immunilogical:   No cervical lymphadenopathy. Cardiovascular:   RRR. Symmetric bilateral radial and DP pulses.  No murmurs. Cap refill less than 2 seconds. Respiratory:   Normal respiratory effort without tachypnea/retractions. Breath sounds are clear and equal bilaterally. No wheezes/rales/rhonchi. Gastrointestinal:   Soft and nontender. Non distended. There is no CVA tenderness.  No rebound, rigidity, or guarding. Genitourinary:   deferred Musculoskeletal:   Normal range of motion in all extremities. No joint effusions.  No lower extremity tenderness.  No edema. Neurologic:   Normal speech and language.  Cranial nerves II through XII intact No pronator drift.  Normal finger-to-nose and cerebellar function Motor grossly intact. Symmetric sensation NIH stroke scale 0 No acute focal neurologic deficits are appreciated.  Skin:    Skin is warm, dry and intact. No rash noted.  No petechiae, purpura, or bullae.  ____________________________________________    LABS  (pertinent positives/negatives) (all labs ordered are listed, but only abnormal results are displayed) Labs Reviewed  URINALYSIS, COMPLETE (UACMP) WITH MICROSCOPIC - Abnormal; Notable for the following components:      Result Value   Color, Urine YELLOW (*)    APPearance CLEAR (*)    All other components within normal limits  PROTIME-INR - Abnormal; Notable for the following components:   Prothrombin Time 15.3 (*)    All other components within normal limits  ETHANOL  CBC  DIFFERENTIAL  COMPREHENSIVE METABOLIC PANEL  APTT   ____________________________________________   EKG  Interpreted by me Ventricular paced rhythm, rate of 92, left axis, left bundle branch block, no acute ischemic changes.  ____________________________________________    [id number]  Ct Head Wo Contrast  Result Date: 09/26/2018 CLINICAL DATA:  85 year old female with resolved TIA. EXAM: CT HEAD WITHOUT CONTRAST TECHNIQUE: Contiguous axial images were obtained from the base of the skull through the vertex without intravenous contrast. COMPARISON:  Head CT dated 07/27/2008 FINDINGS: Brain: There is mild age-related atrophy and chronic microvascular ischemic changes. There is no acute intracranial hemorrhage. No mass effect or midline shift. No extra-axial fluid collection. Vascular: No hyperdense vessel or unexpected calcification. Skull: Normal. Negative for fracture or focal lesion. Sinuses/Orbits: No acute finding. Other: None IMPRESSION: 1. No acute intracranial hemorrhage. 2. Mild age-related atrophy and chronic microvascular ischemic changes. Electronically Signed   By: Anner Crete M.D.   On: 09/26/2018 21:05    ____________________________________________   PROCEDURES Procedures  ____________________________________________  DIFFERENTIAL DIAGNOSIS   Intracranial hemorrhage, ischemic stroke, dehydration, fatigue  CLINICAL IMPRESSION / ASSESSMENT AND PLAN / ED COURSE  Pertinent labs & imaging  results that were available during my care of the patient were reviewed by me and considered in my medical decision making (see chart for details).   Megan Rodgers was evaluated in Emergency Department on 09/26/2018 for the symptoms described in the history of present illness. She was evaluated in the context of the global COVID-19 pandemic, which necessitated consideration that the patient might be at risk for infection with the SARS-CoV-2 virus that causes COVID-19. Institutional protocols and algorithms that pertain to the evaluation of patients at risk for COVID-19 are in a state of rapid change based on information released by regulatory bodies including the CDC and federal and state organizations. These policies and algorithms were followed during the patient's care in the ED.   Patient presents with weakness and concern for TIA without focal neurologic symptoms.  Stroke scale is 0 on arrival.  Will check labs and CT scan.  Clinical Course as of Sep 25 2324  Sat Sep 26, 2018  2112 CT unremarkable.  Unable to obtain MRI due to pacemaker.  However, she is on Pradaxa, making embolic stroke very unlikely.  With symptoms resolved, we will follow-up the rest of the labs, plan for outpatient follow-up closely with neurology.   [PS]    Clinical Course User Index [PS] Carrie Mew, MD     ----------------------------------------- 11:26 PM on 09/26/2018 -----------------------------------------  Remainder of labs including urinalysis unremarkable.  Vital signs unchanged, hypertension which can be followed up by primary care, continue taking home medications.  Discharge home.  ____________________________________________   FINAL CLINICAL IMPRESSION(S) / ED DIAGNOSES    Final diagnoses:  Weakness  Permanent atrial fibrillation     ED Discharge Orders    None      Portions of this note were generated with dragon dictation software. Dictation errors may occur despite best  attempts at proofreading.   Carrie Mew, MD 09/26/18 2326

## 2018-09-26 NOTE — ED Triage Notes (Signed)
Pt arrives from home with resolved TIA. Pt states began around lunchtime. Per ems pt with tia yesterday that resolved. Pt with pacemaker, neuro status intact. md at bedside.

## 2018-09-26 NOTE — ED Notes (Signed)
Pt resting, denies needs at this time.

## 2018-09-27 NOTE — ED Notes (Signed)
Family here to pick pt up. Reviewed discharge instructions with son at pt's vehicle.

## 2019-02-03 ENCOUNTER — Emergency Department: Payer: Medicare Other

## 2019-02-03 ENCOUNTER — Encounter: Payer: Self-pay | Admitting: Emergency Medicine

## 2019-02-03 ENCOUNTER — Other Ambulatory Visit: Payer: Self-pay

## 2019-02-03 ENCOUNTER — Inpatient Hospital Stay
Admission: EM | Admit: 2019-02-03 | Discharge: 2019-02-05 | DRG: 069 | Disposition: A | Discharge status: Home Health | Payer: Medicare Other | Attending: Internal Medicine | Admitting: Internal Medicine

## 2019-02-03 DIAGNOSIS — I482 Chronic atrial fibrillation, unspecified: Secondary | ICD-10-CM | POA: Diagnosis present

## 2019-02-03 DIAGNOSIS — Z66 Do not resuscitate: Secondary | ICD-10-CM | POA: Diagnosis present

## 2019-02-03 DIAGNOSIS — B9620 Unspecified Escherichia coli [E. coli] as the cause of diseases classified elsewhere: Secondary | ICD-10-CM | POA: Diagnosis present

## 2019-02-03 DIAGNOSIS — Z79890 Hormone replacement therapy (postmenopausal): Secondary | ICD-10-CM | POA: Diagnosis not present

## 2019-02-03 DIAGNOSIS — Z85828 Personal history of other malignant neoplasm of skin: Secondary | ICD-10-CM

## 2019-02-03 DIAGNOSIS — Z885 Allergy status to narcotic agent status: Secondary | ICD-10-CM | POA: Diagnosis not present

## 2019-02-03 DIAGNOSIS — G459 Transient cerebral ischemic attack, unspecified: Principal | ICD-10-CM | POA: Diagnosis present

## 2019-02-03 DIAGNOSIS — R29700 NIHSS score 0: Secondary | ICD-10-CM | POA: Diagnosis present

## 2019-02-03 DIAGNOSIS — I361 Nonrheumatic tricuspid (valve) insufficiency: Secondary | ICD-10-CM | POA: Diagnosis not present

## 2019-02-03 DIAGNOSIS — Z7901 Long term (current) use of anticoagulants: Secondary | ICD-10-CM

## 2019-02-03 DIAGNOSIS — F0390 Unspecified dementia without behavioral disturbance: Secondary | ICD-10-CM | POA: Diagnosis present

## 2019-02-03 DIAGNOSIS — I10 Essential (primary) hypertension: Secondary | ICD-10-CM | POA: Diagnosis present

## 2019-02-03 DIAGNOSIS — Z20828 Contact with and (suspected) exposure to other viral communicable diseases: Secondary | ICD-10-CM | POA: Diagnosis present

## 2019-02-03 DIAGNOSIS — Z950 Presence of cardiac pacemaker: Secondary | ICD-10-CM | POA: Diagnosis not present

## 2019-02-03 DIAGNOSIS — N390 Urinary tract infection, site not specified: Secondary | ICD-10-CM | POA: Diagnosis present

## 2019-02-03 DIAGNOSIS — Z7951 Long term (current) use of inhaled steroids: Secondary | ICD-10-CM

## 2019-02-03 DIAGNOSIS — Z91041 Radiographic dye allergy status: Secondary | ICD-10-CM | POA: Diagnosis not present

## 2019-02-03 DIAGNOSIS — E871 Hypo-osmolality and hyponatremia: Secondary | ICD-10-CM | POA: Diagnosis present

## 2019-02-03 DIAGNOSIS — I340 Nonrheumatic mitral (valve) insufficiency: Secondary | ICD-10-CM | POA: Diagnosis not present

## 2019-02-03 DIAGNOSIS — E039 Hypothyroidism, unspecified: Secondary | ICD-10-CM | POA: Diagnosis present

## 2019-02-03 DIAGNOSIS — Z881 Allergy status to other antibiotic agents status: Secondary | ICD-10-CM

## 2019-02-03 DIAGNOSIS — Z90710 Acquired absence of both cervix and uterus: Secondary | ICD-10-CM | POA: Diagnosis not present

## 2019-02-03 DIAGNOSIS — G8324 Monoplegia of upper limb affecting left nondominant side: Secondary | ICD-10-CM | POA: Diagnosis present

## 2019-02-03 DIAGNOSIS — Z888 Allergy status to other drugs, medicaments and biological substances status: Secondary | ICD-10-CM | POA: Diagnosis not present

## 2019-02-03 DIAGNOSIS — Z79899 Other long term (current) drug therapy: Secondary | ICD-10-CM | POA: Diagnosis not present

## 2019-02-03 DIAGNOSIS — Z880 Allergy status to penicillin: Secondary | ICD-10-CM | POA: Diagnosis not present

## 2019-02-03 DIAGNOSIS — Z91013 Allergy to seafood: Secondary | ICD-10-CM

## 2019-02-03 DIAGNOSIS — Z8249 Family history of ischemic heart disease and other diseases of the circulatory system: Secondary | ICD-10-CM

## 2019-02-03 LAB — URINALYSIS, COMPLETE (UACMP) WITH MICROSCOPIC
Bilirubin Urine: NEGATIVE
Glucose, UA: NEGATIVE mg/dL
Ketones, ur: NEGATIVE mg/dL
Nitrite: POSITIVE — AB
Protein, ur: NEGATIVE mg/dL
Specific Gravity, Urine: 1.011 (ref 1.005–1.030)
pH: 6 (ref 5.0–8.0)

## 2019-02-03 LAB — CBC
HCT: 40 % (ref 36.0–46.0)
Hemoglobin: 13.2 g/dL (ref 12.0–15.0)
MCH: 32 pg (ref 26.0–34.0)
MCHC: 33 g/dL (ref 30.0–36.0)
MCV: 97.1 fL (ref 80.0–100.0)
Platelets: 169 10*3/uL (ref 150–400)
RBC: 4.12 MIL/uL (ref 3.87–5.11)
RDW: 13.3 % (ref 11.5–15.5)
WBC: 4.6 10*3/uL (ref 4.0–10.5)
nRBC: 0 % (ref 0.0–0.2)

## 2019-02-03 LAB — COMPREHENSIVE METABOLIC PANEL
ALT: 8 U/L (ref 0–44)
AST: 28 U/L (ref 15–41)
Albumin: 3.9 g/dL (ref 3.5–5.0)
Alkaline Phosphatase: 75 U/L (ref 38–126)
Anion gap: 8 (ref 5–15)
BUN: 16 mg/dL (ref 8–23)
CO2: 25 mmol/L (ref 22–32)
Calcium: 9.3 mg/dL (ref 8.9–10.3)
Chloride: 101 mmol/L (ref 98–111)
Creatinine, Ser: 0.77 mg/dL (ref 0.44–1.00)
GFR calc Af Amer: >60 mL/min (ref >60)
GFR calc non Af Amer: >60 mL/min (ref >60)
Glucose, Bld: 107 mg/dL — ABNORMAL HIGH (ref 70–99)
Potassium: 4.9 mmol/L (ref 3.5–5.1)
Sodium: 134 mmol/L — ABNORMAL LOW (ref 135–145)
Total Bilirubin: 0.9 mg/dL (ref 0.3–1.2)
Total Protein: 6.7 g/dL (ref 6.5–8.1)

## 2019-02-03 LAB — TROPONIN I (HIGH SENSITIVITY)
Troponin I (High Sensitivity): 10 ng/L (ref <18)
Troponin I (High Sensitivity): 11 ng/L (ref <18)

## 2019-02-03 LAB — SARS CORONAVIRUS 2 (TAT 6-24 HRS): SARS Coronavirus 2: NEGATIVE

## 2019-02-03 MED ORDER — ALENDRONATE SODIUM 70 MG PO TABS: 70.0000 mg | ORAL_TABLET | ORAL | Status: DC

## 2019-02-03 MED ORDER — LEVOTHYROXINE SODIUM 75 MCG PO TABS
75.0000 ug | ORAL_TABLET | Freq: Every day | ORAL | Status: DC
  Administered 2019-02-04 – 2019-02-05 (×2): 75 ug via ORAL
  Filled 2019-02-03: qty 3
  Filled 2019-02-03 (×2): qty 1

## 2019-02-03 MED ORDER — SODIUM CHLORIDE 0.9 % IV SOLN
1.0000 g | INTRAVENOUS | Status: DC
  Administered 2019-02-03 – 2019-02-04 (×2): 1 g via INTRAVENOUS
  Filled 2019-02-03: qty 1
  Filled 2019-02-03: qty 10
  Filled 2019-02-03: qty 1

## 2019-02-03 MED ORDER — SENNOSIDES-DOCUSATE SODIUM 8.6-50 MG PO TABS: 1.0000 | ORAL_TABLET | Freq: Every evening | ORAL | Status: DC | PRN

## 2019-02-03 MED ORDER — FOSFOMYCIN TROMETHAMINE 3 G PO PACK
3.0000 g | PACK | Freq: Once | ORAL | Status: AC
  Administered 2019-02-03: 3 g via ORAL
  Filled 2019-02-03: qty 3

## 2019-02-03 MED ORDER — PROPRANOLOL HCL 20 MG PO TABS
60.0000 mg | ORAL_TABLET | Freq: Every day | ORAL | Status: DC
  Administered 2019-02-04 – 2019-02-05 (×2): 60 mg via ORAL
  Filled 2019-02-03 (×2): qty 3

## 2019-02-03 MED ORDER — DONEPEZIL HCL 5 MG PO TABS
5.0000 mg | ORAL_TABLET | Freq: Every day | ORAL | Status: DC
  Administered 2019-02-03 – 2019-02-04 (×2): 5 mg via ORAL
  Filled 2019-02-03 (×2): qty 1

## 2019-02-03 MED ORDER — ACETAMINOPHEN 325 MG PO TABS
650.0000 mg | ORAL_TABLET | ORAL | Status: DC | PRN
  Administered 2019-02-04: 15:00:00 650 mg via ORAL
  Filled 2019-02-03: qty 2

## 2019-02-03 MED ORDER — SODIUM CHLORIDE 0.9 % IV SOLN
INTRAVENOUS | Status: DC
  Administered 2019-02-03 – 2019-02-05 (×3): via INTRAVENOUS

## 2019-02-03 MED ORDER — LEVOFLOXACIN IN D5W 500 MG/100ML IV SOLN
500.0000 mg | Freq: Once | INTRAVENOUS | Status: AC
  Administered 2019-02-03: 500 mg via INTRAVENOUS
  Filled 2019-02-03: qty 100

## 2019-02-03 MED ORDER — ESTRADIOL 0.1 MG/GM VA CREA
1.0000 | TOPICAL_CREAM | Freq: Every day | VAGINAL | Status: DC
  Filled 2019-02-03: qty 42.5

## 2019-02-03 MED ORDER — ACETAMINOPHEN 160 MG/5ML PO SOLN
650.0000 mg | ORAL | Status: DC | PRN
  Administered 2019-02-05: 11:00:00 650 mg
  Filled 2019-02-03 (×2): qty 20.3

## 2019-02-03 MED ORDER — CITALOPRAM HYDROBROMIDE 10 MG PO TABS
5.0000 mg | ORAL_TABLET | Freq: Every day | ORAL | Status: DC
  Administered 2019-02-04 – 2019-02-05 (×2): 5 mg via ORAL
  Filled 2019-02-03 (×2): qty 1

## 2019-02-03 MED ORDER — FLUTICASONE PROPIONATE 50 MCG/ACT NA SUSP
1.0000 | Freq: Every day | NASAL | Status: DC
  Administered 2019-02-05: 11:00:00 1 via NASAL
  Filled 2019-02-03: qty 16

## 2019-02-03 MED ORDER — ASPIRIN 325 MG PO TABS
325.0000 mg | ORAL_TABLET | Freq: Every day | ORAL | Status: DC
  Administered 2019-02-03 – 2019-02-05 (×3): 325 mg via ORAL
  Filled 2019-02-03 (×3): qty 1

## 2019-02-03 MED ORDER — STROKE: EARLY STAGES OF RECOVERY BOOK
Freq: Once | Status: AC
  Administered 2019-02-03: 23:00:00

## 2019-02-03 MED ORDER — ASPIRIN 300 MG RE SUPP
300.0000 mg | Freq: Every day | RECTAL | Status: DC
  Filled 2019-02-03: qty 1

## 2019-02-03 MED ORDER — DABIGATRAN ETEXILATE MESYLATE 150 MG PO CAPS
150.0000 mg | ORAL_CAPSULE | Freq: Two times a day (BID) | ORAL | Status: DC
  Administered 2019-02-03 – 2019-02-05 (×4): 150 mg via ORAL
  Filled 2019-02-03 (×5): qty 1

## 2019-02-03 MED ORDER — PRIMIDONE 50 MG PO TABS
50.0000 mg | ORAL_TABLET | Freq: Four times a day (QID) | ORAL | Status: DC
  Administered 2019-02-03 – 2019-02-05 (×6): 50 mg via ORAL
  Filled 2019-02-03 (×9): qty 1

## 2019-02-03 MED ORDER — ACETAMINOPHEN 650 MG RE SUPP: 650.0000 mg | RECTAL | Status: DC | PRN

## 2019-02-03 NOTE — ED Notes (Signed)
Helped pt to the restroom with assistance from a walker. Pt did very well and is now resting in bed watching TV.

## 2019-02-03 NOTE — ED Notes (Signed)
Admitting MD notified of patient's reaction to levaquin via secure chat, requested to know if patient could eat.

## 2019-02-03 NOTE — ED Provider Notes (Addendum)
[facility] Emergency Department Provider Note  Time seen: 1:52 PM  I have reviewed the triage vital signs and the nursing notes.   HISTORY  Chief Complaint Altered Mental Status   HPI Megan Rodgers is a 85 y.o. female with a past medical history of hypertension, hyponatremia, presents to the emergency department for confusion.  According to EMS report the patient was talking to her daughter on the phone when she acutely became confused per report.  Was also experiencing left arm and hand tingling and numbness.  By the time the patient arrived to the emergency department she states the tingling numbness have resolved, no longer is confused, patient currently is alert and oriented x4 when asked about the confusion earlier she states "while that is what they tell me."  Patient denies any fever cough congestion or shortness of breath.  Denies any chest pain or abdominal pain.   Past Medical History:  Diagnosis Date  . Atrial fibrillation ([place])   . Cancer ([place])    skin ca  . Hypertension   . Thyroid disease   . Tremors of nervous system     Patient Active Problem List   Diagnosis Date Noted  . Protein-calorie malnutrition, severe 04/08/2018  . Hyponatremia 04/07/2018  . Fall at home, initial encounter 04/07/2018  . Pain in joint involving ankle and foot 08/20/2017  . Essential hypertension 08/20/2017    Past Surgical History:  Procedure Laterality Date  . ABDOMINAL HYSTERECTOMY    . BREAST CYST ASPIRATION Left    lt fna-neg  . CARDIAC ELECTROPHYSIOLOGY STUDY AND ABLATION    . PACEMAKER INSERTION    . TONSILLECTOMY      Prior to Admission medications   Medication Sig Start Date End Date Taking? Authorizing Provider  cephALEXin (KEFLEX) 500 MG capsule Take 1 capsule (500 mg total) by mouth every 12 (twelve) hours. 04/10/18   Hillary Bow, MD  Cholecalciferol (VITAMIN D-3) 1000 units CAPS Take 1 capsule (1,000 Units total) by mouth daily. 04/10/18    Hillary Bow, MD  citalopram (CELEXA) 10 MG tablet Take 10 mg by mouth daily.    [provider]  dabigatran (PRADAXA) 150 MG CAPS capsule Take 150 mg by mouth 2 (two) times daily.    [provider]  estradiol (ESTRACE) 0.1 MG/GM vaginal cream Place 1 Applicatorful vaginally at bedtime.    [provider]  fluticasone (FLONASE) 50 MCG/ACT nasal spray Place into both nostrils daily.    [provider]  LACTOBACILLUS PO Take by mouth.    [provider]  levothyroxine (SYNTHROID, LEVOTHROID) 50 MCG tablet Take 75 mcg by mouth daily before breakfast.     [provider]  Multiple Vitamin (MULTIVITAMIN) capsule Take 1 capsule by mouth daily.    [provider]  naphazoline-glycerin (CLEAR EYES REDNESS) 0.012-0.2 % SOLN Apply to eye.    [provider]  primidone (MYSOLINE) 50 MG tablet Take 50 mg by mouth 3 (three) times daily.    [provider]  propranolol (INDERAL) 60 MG tablet Take 60 mg by mouth daily.    [provider]    Allergies  Allergen Reactions  . Penicillins Anaphylaxis    Has patient had a PCN reaction causing immediate rash, facial/tongue/throat swelling, SOB or lightheadedness with hypotension: Yes Has patient had a PCN reaction causing severe rash involving mucus membranes or skin necrosis: No Has patient had a PCN reaction that required hospitalization: No Has patient had a PCN reaction occurring  within the last 10 years: Unknown If all of the above answers are "NO", then may proceed with Cephalosporin use.   . Codeine   . Iodinated Diagnostic Agents   . Metronidazole   . Shellfish-Derived Products   . Tetracyclines & Related     Family History  Problem Relation Age of Onset  . Hypertension Mother   . Breast cancer Neg Hx     Social History Social History   Tobacco Use  . Smoking status: Never Smoker  . Smokeless tobacco: Never Used  Substance Use Topics  . Alcohol  use: No  . Drug use: No    Review of Systems Constitutional: Negative for fever. Cardiovascular: Negative for chest pain. Respiratory: Negative for shortness of breath. Gastrointestinal: Negative for abdominal pain Genitourinary: Negative for urinary compaints Musculoskeletal: Negative for musculoskeletal complaints Skin: Negative for skin complaints  Neurological: Negative for headache and hand weakness/tingling now resolved. All other ROS negative  ____________________________________________   PHYSICAL EXAM:  VITAL SIGNS: ED Triage Vitals [02/03/19 1344]  Enc Vitals Group     BP (!) 171/76     Pulse Rate 76     Resp 18     Temp 98.4 F (36.9 C)     Temp Source Oral     SpO2 99 %     Weight 124 lb (56.2 kg)     Height 5\' 5"  (1.651 m)     Head Circumference      Peak Flow      Pain Score 0     Pain Loc      Pain Edu?      Excl. in [place]?    Constitutional: Alert and oriented. Well appearing and in no distress. Eyes: Normal exam ENT      Head: Normocephalic and atraumatic.      Mouth/Throat: Mucous membranes are moist. Cardiovascular: Normal rate, regular rhythm. Respiratory: Normal respiratory effort without tachypnea nor retractions. Breath sounds are clear  Gastrointestinal: Soft and nontender. No distention.   Musculoskeletal: Nontender with normal range of motion in all extremities.  Neurologic:  Normal speech and language. No gross focal neurologic deficits.  Cranial nerves intact.  Equal grip strength bilaterally.  No pronator drift./5 motor in all extremities.  Denies any sensory deficits. Skin:  Skin is warm, dry and intact.  Psychiatric: Mood and affect are normal.   ____________________________________________    EKG  EKG viewed and interpreted by myself shows a ventricular paced rhythm at 77 bpm with a widened QRS, left axis deviation, non-specific ST changes  ____________________________________________    RADIOLOGY  CT head  negative  ____________________________________________   INITIAL IMPRESSION / ASSESSMENT AND PLAN / ED COURSE  Pertinent labs & imaging results that were available during my care of the patient were reviewed by me and considered in my medical decision making (see chart for details).   Patient presents to the emergency department for acute onset of confusion however this has since resolved.  Patient is currently alert and oriented x4, normal/intact neurological exam without deficits identified.  Differential at this time would include TIA, encephalopathy, metabolic or electrolyte abnormality, infectious etiology.  We will check labs, urinalysis, CT scan head and continue to closely monitor.  Awaiting family arrival for further history.  EMS reports daughter states last known normal was 12:15 PM today however the patient appears to be back at her baseline currently.  Patient's lab work is thus far normal including a negative troponin.  Urinalysis is pending.  Given the  acute onset of confusion with left-sided tingling/numbness we will admit to the hospitalist service for TIA although her symptoms have since resolved.  Patient is having some itching and redness around her Levaquin infusion site.  We will discontinue the Levaquin.  We will dose oral fosfomycin for the patient.  Patient has anaphylactic penicillin allergy.   NIH Stroke Scale   Interval: Baseline Time: 1:56 PM Person Administering Scale: Harvest Dark  Administer stroke scale items in the order listed. Record performance in each category after each subscale exam. Do not go back and change scores. Follow directions provided for each exam technique. Scores should reflect what the patient does, not what the clinician thinks the patient can do. The clinician should record answers while administering the exam and work quickly. Except where indicated, the patient should not be coached (i.e., repeated requests to patient to make a  special effort).   1a  Level of consciousness: 0=alert; keenly responsive  1b. LOC questions:  0=Performs both tasks correctly  1c. LOC commands: 0=Performs both tasks correctly  2.  Best Gaze: 0=normal  3.  Visual: 0=No visual loss  4. Facial Palsy: 0=Normal symmetric movement  5a.  Motor left arm: 0=No drift, limb holds 90 (or 45) degrees for full 10 seconds  5b.  Motor right arm: 0=No drift, limb holds 90 (or 45) degrees for full 10 seconds  6a. motor left leg: 0=No drift, limb holds 90 (or 45) degrees for full 10 seconds  6b  Motor right leg:  0=No drift, limb holds 90 (or 45) degrees for full 10 seconds  7. Limb Ataxia: 0=Absent  8.  Sensory: 0=Normal; no sensory loss  9. Best Language:  0=No aphasia, normal  10. Dysarthria: 0=Normal  11. Extinction and Inattention: 0=No abnormality  12. Distal motor function: 0=Normal   Total:   0    Chaia O Dorion was evaluated in Emergency Department on 02/03/2019 for the symptoms described in the history of present illness. She was evaluated in the context of the global COVID-19 pandemic, which necessitated consideration that the patient might be at risk for infection with the SARS-CoV-2 virus that causes COVID-19. Institutional protocols and algorithms that pertain to the evaluation of patients at risk for COVID-19 are in a state of rapid change based on information released by regulatory bodies including the CDC and federal and state organizations. These policies and algorithms were followed during the patient's care in the ED.  ____________________________________________   FINAL CLINICAL IMPRESSION(S) / ED DIAGNOSES  Transient ischemic attack   Harvest Dark, MD 02/03/19 0630    Harvest Dark, MD 02/03/19 [phone]

## 2019-02-03 NOTE — ED Notes (Signed)
EDP at bedside at this time to update patient, pt's daughter Megan Rodgers also updated with patient permission. Pt given more warm blankets per her request. Will continue to monitor for further patient needs.

## 2019-02-03 NOTE — ED Notes (Signed)
Pt currently sitting up in bed eating apple sauce per her request. Pt refusing sandwich tray.

## 2019-02-03 NOTE — ED Notes (Signed)
Admitting NP notified via secure chat that per MRI tech, pt is not a candidate for MRI due to pacemaker.

## 2019-02-03 NOTE — ED Notes (Signed)
Patient transported to CT 

## 2019-02-03 NOTE — ED Triage Notes (Signed)
Pt presents to ED via [facility] with c/o AMS. Per EMS pt lives at home with son, today pt was at home on the phone with her daughter she had sudden onset confusion. Per EMS pt did not know her last name and had L hand numbness, otherwise negative stroke screen. Upon arrival to ED pt is alert and oriented to person, place, but disoriented to time and states numbness has resolved. Per EMS pt did take medications late today, pt hypertensive upon arrival, per EMS no hx of hypertension.

## 2019-02-03 NOTE — H&P (Signed)
[facility] NAME: Megan Rodgers    MR#:  [id number]  DATE OF BIRTH:  March 17, 1929  DATE OF ADMISSION:  02/03/2019  PRIMARY CARE PHYSICIAN: [facility]   REQUESTING/REFERRING PHYSICIAN: Harvest Dark, MD  CHIEF COMPLAINT:   Chief Complaint  Patient presents with  . Altered Mental Status    HISTORY OF PRESENT ILLNESS:  Megan Rodgers  is a 85 y.o. female with a known history of atrial fibrillation, hypothyroidism, hypertension, and tremor.  She presented to the emergency room via EMS service who reported she had been on the telephone with her daughter when she became acutely confused.  At this time the patient was also complaining of left arm and hand numbness with tingling.  Numbness and tingling of her left upper extremity have resolved prior to arrival to the emergency room.  Patient was found to be awake alert and oriented x4 upon arrival to the emergency room and upon my visit as well.  Patient tells me she lives with her son.  She denies dysuria.  She has noted dark urine however no foul urine odor, urgency, or frequency.  She denies fever, chills, nausea, vomiting, chest pain, or shortness of breath.  She denies cough.  CT brain was completed on arrival demonstrating no acute intracranial abnormalities.  NIH score is 0.  Urinalysis demonstrates small amount leukocytes, many bacteria, and 11-20 WBCs as well as positive nitrites.  She was placed on fosfomycin given her severe penicillin allergy and slight reaction upon administration of Levaquin in the emergency room.  We have admitted her to the hospitalist service for further evaluation and management. PAST MEDICAL HISTORY:   Past Medical History:  Diagnosis Date  . Atrial fibrillation ([place])   . Cancer ([place])    skin ca  . Hypertension   . Thyroid disease   . Tremors of nervous system     PAST SURGICAL HISTORY:   Past Surgical History:  Procedure  Laterality Date  . ABDOMINAL HYSTERECTOMY    . BREAST CYST ASPIRATION Left    lt fna-neg  . CARDIAC ELECTROPHYSIOLOGY STUDY AND ABLATION    . PACEMAKER INSERTION    . TONSILLECTOMY      SOCIAL HISTORY:   Social History   Tobacco Use  . Smoking status: Never Smoker  . Smokeless tobacco: Never Used  Substance Use Topics  . Alcohol use: No    FAMILY HISTORY:   Family History  Problem Relation Age of Onset  . Hypertension Mother   . Breast cancer Neg Hx     DRUG ALLERGIES:   Allergies  Allergen Reactions  . Penicillins Anaphylaxis    Has patient had a PCN reaction causing immediate rash, facial/tongue/throat swelling, SOB or lightheadedness with hypotension: Yes Has patient had a PCN reaction causing severe rash involving mucus membranes or skin necrosis: No Has patient had a PCN reaction that required hospitalization: No Has patient had a PCN reaction occurring within the last 10 years: Unknown If all of the above answers are "NO", then may proceed with Cephalosporin use.   . Codeine   . Iodinated Diagnostic Agents   . Metronidazole   . Shellfish-Derived Products   . Tetracyclines & Related     REVIEW OF SYSTEMS:   Review of Systems  Constitutional: Negative for chills, fever and malaise/fatigue.  HENT: Negative for congestion, sinus pain and sore throat.   Eyes: Negative for blurred vision and double vision.  Respiratory: Negative  for cough, sputum production, shortness of breath and wheezing.   Cardiovascular: Negative for chest pain, palpitations and leg swelling.  Gastrointestinal: Negative for abdominal pain, blood in stool, constipation, diarrhea, heartburn, nausea and vomiting.  Genitourinary: Negative for dysuria, flank pain, frequency, hematuria and urgency.       Dark urine  Musculoskeletal: Negative for back pain, falls, joint pain, myalgias and neck pain.  Skin: Negative for itching and rash.  Neurological: Positive for tingling (left upper  extremity) and sensory change (numbness of left upper extremity). Negative for dizziness and headaches.  Psychiatric/Behavioral: Negative.    MEDICATIONS AT HOME:   Prior to Admission medications   Medication Sig Start Date End Date Taking? Authorizing Provider  alendronate (FOSAMAX) 70 MG tablet Take 70 mg by mouth once a week. 06/29/18 06/29/19 Yes [provider]  Cholecalciferol (VITAMIN D-3) 1000 units CAPS Take 1 capsule (1,000 Units total) by mouth daily. 04/10/18  Yes Sudini, Alveta Heimlich, MD  citalopram (CELEXA) 10 MG tablet Take 5 mg by mouth daily. 06/29/18  Yes [provider]  dabigatran (PRADAXA) 150 MG CAPS capsule Take 150 mg by mouth 2 (two) times daily.   Yes [provider]  donepezil (ARICEPT) 5 MG tablet Take 5 mg by mouth at bedtime. 01/11/19  Yes [provider]  estradiol (ESTRACE) 0.1 MG/GM vaginal cream Place 1 Applicatorful vaginally at bedtime.   Yes [provider]  LACTOBACILLUS PO Take by mouth.   Yes [provider]  levothyroxine (SYNTHROID) 75 MCG tablet Take 75 mcg by mouth daily. Take on an empty stomach with a glass of water at least 30-60 minutes before breakfast 09/28/18 09/28/19 Yes [provider]  Multiple Vitamin (MULTIVITAMIN) capsule Take 1 capsule by mouth daily.   Yes [provider]  primidone (MYSOLINE) 50 MG tablet Take 50 mg by mouth 4 (four) times daily. 06/29/18 06/29/19 Yes [provider]  propranolol (INDERAL) 60 MG tablet Take 60 mg by mouth daily.   Yes [provider]  cephALEXin (KEFLEX) 500 MG capsule Take 1 capsule (500 mg total) by mouth every 12 (twelve) hours. Patient not taking: Reported on 02/03/2019 04/10/18   Hillary Bow, MD  fluticasone [facility]) 50 MCG/ACT nasal spray Place into both nostrils daily.    [provider]  naphazoline-glycerin (CLEAR EYES REDNESS) 0.012-0.2 % SOLN Apply to eye.    [provider]  triamcinolone (KENALOG)  0.025 % cream Apply 1 application topically 2 (two) times daily. 01/28/19   [provider]      VITAL SIGNS:  Blood pressure (!) 166/81, pulse 75, temperature 98.4 F (36.9 C), temperature source Oral, resp. rate 15, height 5\' 5"  (1.651 m), weight 56.2 kg, SpO2 100 %.  PHYSICAL EXAMINATION:  Physical Exam  GENERAL:  85 y.o.-year-old patient lying in the bed with no acute distress.  EYES: Pupils equal, round, reactive to light and accommodation. No scleral icterus. Extraocular muscles intact.  HEENT: Head atraumatic, normocephalic. Oropharynx and nasopharynx clear.  NECK:  Supple, no jugular venous distention. No thyroid enlargement, no tenderness.  LUNGS: Normal breath sounds bilaterally, no wheezing, rales,rhonchi or crepitation. No use of accessory muscles of respiration.  CARDIOVASCULAR: Regular rate and rhythm, S1, S2 normal. No murmurs, rubs, or gallops.  ABDOMEN: Soft, nondistended, nontender. Bowel sounds present. No organomegaly or mass.  EXTREMITIES: No pedal edema, cyanosis, or clubbing.  NEUROLOGIC: Cranial nerves II through XII are intact. Muscle strength 5/5 in all extremities. Sensation intact. Gait not checked.  PSYCHIATRIC: The patient  is alert and oriented x 4.  Normal affect and good eye contact. SKIN: No obvious rash, lesion, or ulcer.   LABORATORY PANEL:   CBC Recent Labs  Lab 02/03/19 1349  WBC 4.6  HGB 13.2  HCT 40.0  PLT 169   ------------------------------------------------------------------------------------------------------------------  Chemistries  Recent Labs  Lab 02/03/19 1349  NA 134*  K 4.9  CL 101  CO2 25  GLUCOSE 107*  BUN 16  CREATININE 0.77  CALCIUM 9.3  AST 28  ALT 8  ALKPHOS 75  BILITOT 0.9   ------------------------------------------------------------------------------------------------------------------  Cardiac Enzymes No results for input(s): TROPONINI in the last 168 hours.  ------------------------------------------------------------------------------------------------------------------  RADIOLOGY:  Ct Head Wo Contrast  Result Date: 02/03/2019 CLINICAL DATA:  Confusion, acute EXAM: CT HEAD WITHOUT CONTRAST TECHNIQUE: Contiguous axial images were obtained from the base of the skull through the vertex without intravenous contrast. COMPARISON:  September 26, 2018 FINDINGS: Brain: No evidence of acute territorial infarction, hemorrhage, hydrocephalus,extra-axial collection or mass lesion/mass effect. There is mild dilatation the ventricles and sulci consistent with age-related atrophy. Low-attenuation changes in the deep white matter consistent with small vessel ischemia. Vascular: No hyperdense vessel or unexpected calcification. Skull: The skull is intact. No fracture or focal lesion identified. Sinuses/Orbits: The visualized paranasal sinuses and mastoid air cells are clear. The orbits and globes intact. Other: None IMPRESSION: No acute intracranial abnormality. Findings consistent with mild age related atrophy and chronic small vessel ischemia Electronically Signed   By: Prudencio Pair M.D.   On: 02/03/2019 16:22      IMPRESSION AND PLAN:   1.  TIA - Pradaxa continued - We will continue neurochecks - Carotid Doppler studies and echocardiogram - Unable to obtain MRI given patient's history of pacemaker placement - Physical therapy consulted - Neurology, Dr. Doy Mince consulted for further evaluation and recommendations -Lipid panel pending -Hemoglobin A1c  2.  Urinary tract infection - Urine culture pending - Patient started on fosfomycin given her history of allergies -Will adjust treatment accordingly to urine culture results  3.  History of atrial fibrillation - Telemetry monitoring - Pradaxa continue - Inderal continued  4.  Hypothyroidism - TSH pending - Synthroid continued  5.  Dementia -Aricept continued  DVT and PPI prophylaxis initiated     All the records are reviewed and case discussed with ED provider. The plan of care was discussed in details with the patient (and family). I answered all questions. The patient agreed to proceed with the above mentioned plan. Further management will depend upon hospital course.   CODE STATUS: Full code  TOTAL TIME TAKING CARE OF THIS PATIENT: 45 minutes.    [place] on 02/03/2019 at 7:03 PM  Pager - [phone]  After 6pm go to [email or web address] - Proofreader  [facility]  Office  [phone]  CC: Primary care physician; [facility]   Note: This dictation was prepared with Dragon dictation along with smaller phrase technology. Any transcriptional errors that result from this process are unintentional.

## 2019-02-03 NOTE — ED Notes (Signed)
Patient c/o itching at IV site. Redness noted, but no edema. Levaquin stopped and Dr. Kerman Passey informed. IV site was flushed and locked.

## 2019-02-03 NOTE — ED Notes (Signed)
Admitting MD at bedside.

## 2019-02-03 NOTE — ED Notes (Signed)
Pt repositioned in bed at this time. NAD noted. Lights dimmed for patient comfort. News turned on for patient entertainment. Pt denies further needs. Will continue to monitor for further patient needs.

## 2019-02-03 NOTE — Progress Notes (Signed)
Family Meeting Note  Advance Directive:yes  Today a meeting took place with the Patient.   The following clinical team members were present during this meeting:MD  The following were discussed:Patient's diagnosis: Frequent urination, left-sided weakness which is currently resolved, hypertension, chronic atrial fibrillation will be admitted to the hospital.  Treatment plan of care discussed in detail with the patient.  She verbalized understanding of the plan.  , Patient's progosis: Unable to determine and Goals for treatment: Full Code  Namira Rosekrans is the healthcare power of attorney  Additional follow-up to be provided: Hospitalist  Time spent during discussion:17 MIN  Nicholes Mango, MD

## 2019-02-03 NOTE — ED Notes (Signed)
ED TO INPATIENT HANDOFF REPORT  ED Nurse Name and Phone #:  Jinny Blossom [phone]  S Name/Age/Gender Megan Rodgers 85 y.o. female Room/Bed: ED18A/ED18A  Code Status   Code Status: DNR  Home/SNF/Other Home Patient oriented to: self, place and situation Is this baseline? Yes   Triage Complete: Triage complete  Chief Complaint ams  Triage Note Pt presents to ED via [facility] with c/o AMS. Per EMS pt lives at home with son, today pt was at home on the phone with her daughter she had sudden onset confusion. Per EMS pt did not know her last name and had L hand numbness, otherwise negative stroke screen. Upon arrival to ED pt is alert and oriented to person, place, but disoriented to time and states numbness has resolved. Per EMS pt did take medications late today, pt hypertensive upon arrival, per EMS no hx of hypertension.    Allergies Allergies  Allergen Reactions  . Penicillins Anaphylaxis    Has patient had a PCN reaction causing immediate rash, facial/tongue/throat swelling, SOB or lightheadedness with hypotension: Yes Has patient had a PCN reaction causing severe rash involving mucus membranes or skin necrosis: No Has patient had a PCN reaction that required hospitalization: No Has patient had a PCN reaction occurring within the last 10 years: Unknown If all of the above answers are "NO", then may proceed with Cephalosporin use.   . Codeine   . Iodinated Diagnostic Agents   . Metronidazole   . Shellfish-Derived Products   . Tetracyclines & Related     Level of Care/Admitting Diagnosis ED Disposition    ED Disposition Condition [facility] Area: [place] [[id number]]  Level of Care: Telemetry [5]  Covid Evaluation: Asymptomatic Screening Protocol (No Symptoms)  Diagnosis: TIA (transient ischemic attack) [244010]  Admitting Physician: Mayer Camel [[id number]]  Attending Physician: Mayer Camel [[id number]]  Estimated length of stay: past  midnight tomorrow  Certification:: I certify this patient will need inpatient services for at least 2 midnights  PT Class (Do Not Modify): Inpatient [101]  PT Acc Code (Do Not Modify): Private [1]       B Medical/Surgery History Past Medical History:  Diagnosis Date  . Atrial fibrillation ([place])   . Cancer ([place])    skin ca  . Hypertension   . Thyroid disease   . Tremors of nervous system    Past Surgical History:  Procedure Laterality Date  . ABDOMINAL HYSTERECTOMY    . BREAST CYST ASPIRATION Left    lt fna-neg  . CARDIAC ELECTROPHYSIOLOGY STUDY AND ABLATION    . PACEMAKER INSERTION    . TONSILLECTOMY       A IV Location/Drains/Wounds Patient Lines/Drains/Airways Status   Active Line/Drains/Airways    Name:   Placement date:   Placement time:   Site:   Days:   Peripheral IV 02/03/19 Left Forearm   02/03/19    1800    Forearm   less than 1          Intake/Output Last 24 hours  Intake/Output Summary (Last 24 hours) at 02/03/2019 1933 Last data filed at 02/03/2019 1842 Gross per 24 hour  Intake 30 ml  Output -  Net 30 ml    Labs/Imaging Results for orders placed or performed during the hospital encounter of 02/03/19 (from the past 48 hour(s))  CBC     Status: None   Collection Time: 02/03/19  1:49 PM  Result Value Ref Range   WBC  4.6 4.0 - 10.5 K/uL   RBC 4.12 3.87 - 5.11 MIL/uL   Hemoglobin 13.2 12.0 - 15.0 g/dL   HCT 40.0 36.0 - 46.0 %   MCV 97.1 80.0 - 100.0 fL   MCH 32.0 26.0 - 34.0 pg   MCHC 33.0 30.0 - 36.0 g/dL   RDW 13.3 11.5 - 15.5 %   Platelets 169 150 - 400 K/uL   nRBC 0.0 0.0 - 0.2 %    Comment: Performed at [facility], [address] [place], [place] [address]  Comprehensive metabolic panel     Status: Abnormal   Collection Time: 02/03/19  1:49 PM  Result Value Ref Range   Sodium 134 (L) 135 - 145 mmol/L   Potassium 4.9 3.5 - 5.1 mmol/L    Comment: HEMOLYSIS AT THIS LEVEL MAY AFFECT RESULT   Chloride 101 98 - 111 mmol/L    CO2 25 22 - 32 mmol/L   Glucose, Bld 107 (H) 70 - 99 mg/dL   BUN 16 8 - 23 mg/dL   Creatinine, Ser 0.77 0.44 - 1.00 mg/dL   Calcium 9.3 8.9 - 10.3 mg/dL   Total Protein 6.7 6.5 - 8.1 g/dL   Albumin 3.9 3.5 - 5.0 g/dL   AST 28 15 - 41 U/L    Comment: HEMOLYSIS AT THIS LEVEL MAY AFFECT RESULT   ALT 8 0 - 44 U/L    Comment: HEMOLYSIS AT THIS LEVEL MAY AFFECT RESULT   Alkaline Phosphatase 75 38 - 126 U/L   Total Bilirubin 0.9 0.3 - 1.2 mg/dL    Comment: HEMOLYSIS AT THIS LEVEL MAY AFFECT RESULT   GFR calc non Af Amer >60 >60 mL/min   GFR calc Af Amer >60 >60 mL/min   Anion gap 8 5 - 15    Comment: Performed at [facility], [address], [place], [place] [address]  Troponin I (High Sensitivity)     Status: None   Collection Time: 02/03/19  1:49 PM  Result Value Ref Range   Troponin I (High Sensitivity) 10 <18 ng/L    Comment: (NOTE) Elevated high sensitivity troponin I (hsTnI) values and significant  changes across serial measurements may suggest ACS but many other  chronic and acute conditions are known to elevate hsTnI results.  Refer to the "Links" section for chest pain algorithms and additional  guidance. Performed at [facility], [address] [place], [place] [address]   Urinalysis, Complete w Microscopic     Status: Abnormal   Collection Time: 02/03/19  3:57 PM  Result Value Ref Range   Color, Urine YELLOW (A) YELLOW   APPearance HAZY (A) CLEAR   Specific Gravity, Urine 1.011 1.005 - 1.030   pH 6.0 5.0 - 8.0   Glucose, UA NEGATIVE NEGATIVE mg/dL   Hgb urine dipstick LARGE (A) NEGATIVE   Bilirubin Urine NEGATIVE NEGATIVE   Ketones, ur NEGATIVE NEGATIVE mg/dL   Protein, ur NEGATIVE NEGATIVE mg/dL   Nitrite POSITIVE (A) NEGATIVE   Leukocytes,Ua SMALL (A) NEGATIVE   RBC / HPF 0-5 0 - 5 RBC/hpf   WBC, UA 11-20 0 - 5 WBC/hpf   Bacteria, UA MANY (A) NONE SEEN   Squamous Epithelial / LPF 0-5 0 - 5   WBC Clumps PRESENT    Mucus PRESENT      Comment: Performed at [facility], [address] [place], [place] [address]  Troponin I (High Sensitivity)     Status: None   Collection Time: 02/03/19  3:57 PM  Result Value Ref Range   Troponin I (High Sensitivity) 11 <18 ng/L    Comment: (NOTE) Elevated high sensitivity troponin I (hsTnI) values and significant  changes across serial measurements may suggest ACS but many other  chronic and acute conditions are known to elevate hsTnI results.  Refer to the "Links" section for chest pain algorithms and additional  guidance. Performed at [facility], [place], [place] [address]    Ct Head Wo Contrast  Result Date: 02/03/2019 CLINICAL DATA:  Confusion, acute EXAM: CT HEAD WITHOUT CONTRAST TECHNIQUE: Contiguous axial images were obtained from the base of the skull through the vertex without intravenous contrast. COMPARISON:  September 26, 2018 FINDINGS: Brain: No evidence of acute territorial infarction, hemorrhage, hydrocephalus,extra-axial collection or mass lesion/mass effect. There is mild dilatation the ventricles and sulci consistent with age-related atrophy. Low-attenuation changes in the deep white matter consistent with small vessel ischemia. Vascular: No hyperdense vessel or unexpected calcification. Skull: The skull is intact. No fracture or focal lesion identified. Sinuses/Orbits: The visualized paranasal sinuses and mastoid air cells are clear. The orbits and globes intact. Other: None IMPRESSION: No acute intracranial abnormality. Findings consistent with mild age related atrophy and chronic small vessel ischemia Electronically Signed   By: Prudencio Pair M.D.   On: 02/03/2019 16:22    Pending Labs Unresulted Labs (From admission, onward)    Start     Ordered   02/04/19 0500  Hemoglobin A1c  Tomorrow morning,   STAT     02/03/19 1901   02/04/19 0500  Lipid panel  Tomorrow morning,   STAT    Comments: Fasting    02/03/19 1901   02/03/19 1740   Urine Culture  Add-on,   AD     02/03/19 1740   02/03/19 1620  SARS CORONAVIRUS 2 Nasal Swab Aptima Multi Swab  (Asymptomatic/Tier 2 Patients Labs)  Once,   STAT    Question Answer Comment  Is this test for diagnosis or screening Screening   Symptomatic for COVID-19 as defined by CDC No   Hospitalized for COVID-19 No   Admitted to ICU for COVID-19 No   Previously tested for COVID-19 No   Resident in a congregate (group) care setting No   Employed in healthcare setting No   Pregnant No      02/03/19 1619          Vitals/Pain Today's Vitals   02/03/19 1800 02/03/19 1830 02/03/19 1900 02/03/19 1930  BP: (!) 162/89 (!) 166/81 (!) 177/90 (!) 161/93  Pulse: 74 75 73   Resp: 16 15 16 13   Temp:      TempSrc:      SpO2: 100% 100% 98%   Weight:      Height:      PainSc:        Isolation Precautions No active isolations  Medications Medications  alendronate (FOSAMAX) tablet 70 mg (has no administration in time range)  citalopram (CELEXA) tablet 5 mg (has no administration in time range)  dabigatran (PRADAXA) capsule 150 mg (has no administration in time range)  donepezil (ARICEPT) tablet 5 mg (has no administration in time range)  estradiol (ESTRACE) vaginal cream 1 Applicatorful (has no administration in time range)  fluticasone (FLONASE) 50 MCG/ACT nasal spray 1 spray (has no administration in time range)  levothyroxine (SYNTHROID) tablet 75 mcg (has no administration in time range)  primidone (MYSOLINE) tablet 50 mg (has no administration in time range)  propranolol (INDERAL) tablet  60 mg (has no administration in time range)   stroke: mapping our early stages of recovery book (has no administration in time range)  0.9 %  sodium chloride infusion (has no administration in time range)  acetaminophen (TYLENOL) tablet 650 mg (has no administration in time range)    Or  acetaminophen (TYLENOL) solution 650 mg (has no administration in time range)    Or  acetaminophen  (TYLENOL) suppository 650 mg (has no administration in time range)  senna-docusate (Senokot-S) tablet 1 tablet (has no administration in time range)  aspirin suppository 300 mg (has no administration in time range)    Or  aspirin tablet 325 mg (has no administration in time range)  cefTRIAXone (ROCEPHIN) 1 g in sodium chloride 0.9 % 100 mL IVPB (has no administration in time range)  levofloxacin (LEVAQUIN) IVPB 500 mg (0 mg Intravenous Stopped 02/03/19 1842)  fosfomycin (MONUROL) packet 3 g (3 g Oral Given 02/03/19 1848)    Mobility walks with device High fall risk   Focused Assessments Neuro Assessment Handoff:  Swallow screen pass? Yes  Cardiac Rhythm: Ventricular paced NIH Stroke Scale ( + Modified Stroke Scale Criteria)  LOC Questions (1b. )   +: Answers one question correctly(Pt states she is 79) LOC Commands (1c. )   + : Performs both tasks correctly Best Gaze (2. )  +: Normal Visual (3. )  +: No visual loss Motor Arm, Left (5a. )   +: No drift Motor Arm, Right (5b. )   +: No drift Motor Leg, Left (6a. )   +: No drift Motor Leg, Right (6b. )   +: No drift Sensory (8. )   +: Normal, no sensory loss Best Language (9. )   +: No aphasia Extinction/Inattention (11.)   +: No Abnormality Modified SS Total  +: 1 Last date known well: 02/03/19 Last time known well: 1215 Neuro Assessment: Exceptions to WDL(Per EMS pt with sudden onset confusion, pt arrives A&O x 3, only disorientation is to time.) Neuro Checks:      Last Documented NIHSS Modified Score: 1 (02/03/19 1930) Has TPA been given? No If patient is a Neuro Trauma and patient is going to OR before floor call report to [facility] nurse: [phone] or [phone]     R Recommendations: See Admitting Provider Note  Report given to:   Additional Notes:  Intermittent confusion noted, not a candidate for MRI due to pacemaker, ventricular paced rhythm.

## 2019-02-03 NOTE — ED Notes (Signed)
Pt returned from CT °

## 2019-02-04 ENCOUNTER — Other Ambulatory Visit: Payer: Medicare Other

## 2019-02-04 ENCOUNTER — Inpatient Hospital Stay: Payer: Medicare Other

## 2019-02-04 ENCOUNTER — Inpatient Hospital Stay (HOSPITAL_COMMUNITY)
Admit: 2019-02-04 | Discharge: 2019-02-04 | Disposition: A | Discharge status: Home / Self Care | Payer: Medicare Other | Attending: Nurse Practitioner | Admitting: Nurse Practitioner

## 2019-02-04 DIAGNOSIS — I361 Nonrheumatic tricuspid (valve) insufficiency: Secondary | ICD-10-CM

## 2019-02-04 DIAGNOSIS — I340 Nonrheumatic mitral (valve) insufficiency: Secondary | ICD-10-CM

## 2019-02-04 DIAGNOSIS — G459 Transient cerebral ischemic attack, unspecified: Principal | ICD-10-CM

## 2019-02-04 LAB — LIPID PANEL
Cholesterol: 205 mg/dL — ABNORMAL HIGH (ref 0–200)
HDL: 57 mg/dL (ref >40)
LDL Cholesterol: 135 mg/dL — ABNORMAL HIGH (ref 0–99)
Total CHOL/HDL Ratio: 3.6 RATIO
Triglycerides: 66 mg/dL (ref <150)
VLDL: 13 mg/dL (ref 0–40)

## 2019-02-04 LAB — HEMOGLOBIN A1C
Hgb A1c MFr Bld: 5.8 % — ABNORMAL HIGH (ref 4.8–5.6)
Mean Plasma Glucose: 119.76 mg/dL

## 2019-02-04 LAB — ECHOCARDIOGRAM COMPLETE
Height: 65 in
Weight: 2042.34 oz

## 2019-02-04 MED ORDER — ADULT MULTIVITAMIN W/MINERALS CH
1.0000 | ORAL_TABLET | Freq: Every day | ORAL | Status: DC
  Administered 2019-02-05: 11:00:00 1 via ORAL
  Filled 2019-02-04: qty 1

## 2019-02-04 MED ORDER — ATORVASTATIN CALCIUM 20 MG PO TABS
40.0000 mg | ORAL_TABLET | Freq: Every day | ORAL | Status: DC
  Administered 2019-02-04: 40 mg via ORAL
  Filled 2019-02-04: qty 2

## 2019-02-04 NOTE — Procedures (Signed)
ELECTROENCEPHALOGRAM REPORT   Patient: Megan Rodgers       Room #: 125A-AA EEG No. ID: [id number] Age: 85 y.o.        Sex: female Referring Physician: Pyreddy Report Date:  02/04/2019        Interpreting Physician: Alexis Goodell  History: Megan Rodgers is an 85 y.o. female with an episode of confusion  Medications:  ASA, Pradaxa, Lipitor, Rocephin, Aricept, Estrace, Synthroid, Mysoline, Inderal  Conditions of Recording:  This is a 21 channel routine scalp EEG performed with bipolar and monopolar montages arranged in accordance to the international 10/20 system of electrode placement. One channel was dedicated to EKG recording.  The patient is in the awake and drowsy states.  Description:  The waking background activity consists of a low voltage, symmetrical, fairly well organized, 6-7 Hz theta activity, seen from the parieto-occipital and posterior temporal regions.  Low voltage fast activity, poorly organized, is seen anteriorly and is at times superimposed on more posterior regions.  A mixture of theta and alpha rhythms are seen from the central and temporal regions. The patient drowses with further slowing to irregular, low voltage theta and beta activity.   Stage II sleep is not obtained. Hyperventilation was not performed.  Intermittent photic stimulation was performed but failed to illicit any change in the tracing.   IMPRESSION: This is an abnormal EEG secondary to posterior background slowing.  This finding may be seen with a diffuse gray matter disturbance that is etiologically nonspecific, but may include a dementia, among other possibilities.  No epileptiform activity is noted.     Alexis Goodell, MD Neurology [phone] 02/04/2019, 2:26 PM

## 2019-02-04 NOTE — Progress Notes (Addendum)
[facility] NAME: Ralphine Hinks    MR#:  [id number]  DATE OF BIRTH:  09/12/1928  SUBJECTIVE:  CHIEF COMPLAINT:   Chief Complaint  Patient presents with  . Altered Mental Status  Patient seen and evaluated today Mental status improved Numbness in the left arm is not there today Tolerating diet okay  REVIEW OF SYSTEMS:    ROS  CONSTITUTIONAL: No documented fever. No fatigue, weakness. No weight gain, no weight loss.  EYES: No blurry or double vision.  ENT: No tinnitus. No postnasal drip. No redness of the oropharynx.  RESPIRATORY: No cough, no wheeze, no hemoptysis. No dyspnea.  CARDIOVASCULAR: No chest pain. No orthopnea. No palpitations. No syncope.  GASTROINTESTINAL: No nausea, no vomiting or diarrhea. No abdominal pain. No melena or hematochezia.  GENITOURINARY: No dysuria or hematuria.  ENDOCRINE: No polyuria or nocturia. No heat or cold intolerance.  HEMATOLOGY: No anemia. No bruising. No bleeding.  INTEGUMENTARY: No rashes. No lesions.  MUSCULOSKELETAL: No arthritis. No swelling. No gout.  NEUROLOGIC: No numbness, tingling, or ataxia. No seizure-type activity.  No confusion PSYCHIATRIC: No anxiety. No insomnia. No ADD.   DRUG ALLERGIES:   Allergies  Allergen Reactions  . Penicillins Anaphylaxis    Has patient had a PCN reaction causing immediate rash, facial/tongue/throat swelling, SOB or lightheadedness with hypotension: Yes Has patient had a PCN reaction causing severe rash involving mucus membranes or skin necrosis: No Has patient had a PCN reaction that required hospitalization: No Has patient had a PCN reaction occurring within the last 10 years: Unknown If all of the above answers are "NO", then may proceed with Cephalosporin use.   . Codeine   . Iodinated Diagnostic Agents   . Metronidazole   . Shellfish-Derived Products   . Tetracyclines & Related     VITALS:  Blood pressure (!) 117/40, pulse 74,  temperature (!) 97.4 F (36.3 C), temperature source Oral, resp. rate 20, height 5\' 5"  (1.651 m), weight 57.9 kg, SpO2 97 %.  PHYSICAL EXAMINATION:   Physical Exam  GENERAL:  85 y.o.-year-old patient lying in the bed with no acute distress.  EYES: Pupils equal, round, reactive to light and accommodation. No scleral icterus. Extraocular muscles intact.  HEENT: Head atraumatic, normocephalic. Oropharynx and nasopharynx clear.  NECK:  Supple, no jugular venous distention. No thyroid enlargement, no tenderness.  LUNGS: Normal breath sounds bilaterally, no wheezing, rales, rhonchi. No use of accessory muscles of respiration.  CARDIOVASCULAR: S1, S2 irregular. No murmurs, rubs, or gallops.  ABDOMEN: Soft, nontender, nondistended. Bowel sounds present. No organomegaly or mass.  EXTREMITIES: No cyanosis, clubbing or edema b/l.    NEUROLOGIC: Cranial nerves II through XII are intact. No focal Motor or sensory deficits b/l.   PSYCHIATRIC: The patient is alert and oriented x 3.  SKIN: No obvious rash, lesion, or ulcer.   LABORATORY PANEL:   CBC Recent Labs  Lab 02/03/19 1349  WBC 4.6  HGB 13.2  HCT 40.0  PLT 169   ------------------------------------------------------------------------------------------------------------------ Chemistries  Recent Labs  Lab 02/03/19 1349  NA 134*  K 4.9  CL 101  CO2 25  GLUCOSE 107*  BUN 16  CREATININE 0.77  CALCIUM 9.3  AST 28  ALT 8  ALKPHOS 75  BILITOT 0.9   ------------------------------------------------------------------------------------------------------------------  Cardiac Enzymes No results for input(s): TROPONINI in the last 168 hours. ------------------------------------------------------------------------------------------------------------------  RADIOLOGY:  Ct Head Wo Contrast  Result Date: 02/03/2019 CLINICAL DATA:  Confusion, acute EXAM: CT HEAD  WITHOUT CONTRAST TECHNIQUE: Contiguous axial images were obtained from the  base of the skull through the vertex without intravenous contrast. COMPARISON:  September 26, 2018 FINDINGS: Brain: No evidence of acute territorial infarction, hemorrhage, hydrocephalus,extra-axial collection or mass lesion/mass effect. There is mild dilatation the ventricles and sulci consistent with age-related atrophy. Low-attenuation changes in the deep white matter consistent with small vessel ischemia. Vascular: No hyperdense vessel or unexpected calcification. Skull: The skull is intact. No fracture or focal lesion identified. Sinuses/Orbits: The visualized paranasal sinuses and mastoid air cells are clear. The orbits and globes intact. Other: None IMPRESSION: No acute intracranial abnormality. Findings consistent with mild age related atrophy and chronic small vessel ischemia Electronically Signed   By: Prudencio Pair M.D.   On: 02/03/2019 16:22   US Carotid Bilateral (at [facility] Only)  Result Date: 02/04/2019 CLINICAL DATA:  85 year old female with symptoms of transient ischemic attack EXAM: BILATERAL CAROTID DUPLEX ULTRASOUND TECHNIQUE: Pearline Cables scale imaging, color Doppler and duplex ultrasound were performed of bilateral carotid and vertebral arteries in the neck. COMPARISON:  Prior carotid duplex ultrasound 07/30/2008 FINDINGS: Criteria: Quantification of carotid stenosis is based on velocity parameters that correlate the residual internal carotid diameter with NASCET-based stenosis levels, using the diameter of the distal internal carotid lumen as the denominator for stenosis measurement. The following velocity measurements were obtained: RIGHT ICA: 64/16 cm/sec CCA: 09/4 cm/sec SYSTOLIC ICA/CCA RATIO:  1.2 ECA:  42 cm/sec LEFT ICA: 55/10 cm/sec CCA: 70/9 cm/sec SYSTOLIC ICA/CCA RATIO:  1.1 ECA:  55 cm/sec RIGHT CAROTID ARTERY: Trace heterogeneous atherosclerotic plaque in the proximal internal carotid artery. By peak systolic velocity criteria, the estimated stenosis is less than 50%. RIGHT VERTEBRAL  ARTERY:  Patent with normal antegrade flow. LEFT CAROTID ARTERY: Heterogeneous atherosclerotic plaque in the proximal internal carotid artery. By peak systolic velocity criteria, the estimated stenosis is less than 50%. LEFT VERTEBRAL ARTERY:  Patent with normal antegrade flow. IMPRESSION: 1. Mild (1-49%) stenosis proximal right internal carotid artery secondary to mild heterogeneous atherosclerotic plaque. 2. Mild (1-49%) stenosis proximal left internal carotid artery secondary to mild heterogeneous atherosclerotic plaque. 3. The vertebral arteries are patent with normal antegrade flow. Signed, Criselda Peaches, MD, [facility] Electronically Signed   By: Jacqulynn Cadet M.D.   On: 02/04/2019 08:37     ASSESSMENT AND PLAN:   85 year old female patient with history of A. fib on Pradaxa, hypertension currently under hospitalist service for confusion and left arm numbness  -Transient ischemic attack MRI brain could not be done secondary to pacemaker CT head reviewed As per neurology evaluation Continue Pradaxa for anticoagulation Frequent neuro checks Follow-up EEG Physical therapy and occupational therapy and speech therapy Carotid ultrasound reviewed Follow-up echocardiogram  -Chronic atrial fibrillation Continue Pradaxa for anticoagulation Tinea propranolol for rate control  -Acute UTI Oral fosfomycin 1 dose given Continue primidone  -Hypothyroidism Continue levothyroxine  -DVT prophylaxis On Pradaxa All the records are reviewed and case discussed with Care Management/Social Worker. Management plans discussed with the patient, family and they are in agreement.  CODE STATUS: DNR  DVT Prophylaxis: SCDs  TOTAL TIME TAKING CARE OF THIS PATIENT: 37 minutes.   POSSIBLE D/C IN 1 to 2 DAYS, DEPENDING ON CLINICAL CONDITION.  Saundra Shelling M.D on 02/04/2019 at 1:35 PM  Between 7am to 6pm - Pager -  [phone]  After 6pm go to [email or web address] - password [email or web address] [place]  Office  [phone]  CC: Primary care  physician; Scot Dock, [facility]  Note: This dictation was prepared with Dragon dictation along with smaller phrase technology. Any transcriptional errors that result from this process are unintentional.

## 2019-02-04 NOTE — Progress Notes (Signed)
eeg completed ° °

## 2019-02-04 NOTE — Evaluation (Signed)
Occupational Therapy Evaluation Patient Details Name: Megan Rodgers MRN: [id number] DOB: [birth year] Today's Date: 02/04/2019    History of Present Illness 85 y.o. female with a past medical history of hypertension, hyponatremia, presents to the emergency department for confusion. Dx: TIA/stroke work up CT: No acute intracranial abnormality   Clinical Impression   Patient is 85 year old female who was living at home with her son, Megan Rodgers. She was modified independent with RW to and from the bathroom.  Pt lives in a one story house with 3 steps to the door and L side railing. Patient requires min guard to min assist for self feeding and grooming skills mainly for tremors in B hands that have been present for many years.  She required min assist for balance only when leaning forward sitting EOB to remove socks and replace.  Patient presents with intact sensation, coordination and proprioception.  No visual deficits. Rec continued OT skills while in hospital and assess for [facility] needs but most likely she will not need any further OT after discharge.       Follow Up Recommendations  No OT follow up    Equipment Recommendations  Other (comment)(weighted utensils for self feeding and possibly utensil holder for toothbrush)    Recommendations for Other Services       Precautions / Restrictions Precautions Precautions: Fall Restrictions Weight Bearing Restrictions: No      Mobility Bed Mobility Overal bed mobility: Modified Independent             General bed mobility comments: increase time and effort  Transfers Overall transfer level: Needs assistance Equipment used: Rolling walker (2 wheeled) Transfers: Sit to/from Stand Sit to Stand: Supervision         General transfer comment: vc's provided provided for hand and body positioning with RW management    Balance Overall balance assessment: Needs assistance Sitting-balance support: No upper extremity supported;Feet  supported Sitting balance-Leahy Scale: Good Sitting balance - Comments: steady sitting and reaching within BOS   Standing balance support: Single extremity supported Standing balance-Leahy Scale: Fair Standing balance comment: requires at least one UE support on RW to maintian balance.                           ADL either performed or assessed with clinical judgement   ADL Overall ADL's : Needs assistance/impaired Eating/Feeding: Set up;Minimal assistance;Sitting Eating/Feeding Details (indicate cue type and reason): pt has B hand tremors and would benefit from weighted utensils with larger handles---tremors have been present for years Grooming: Wash/dry hands;Wash/dry face;Oral care;Set up;Minimal assistance;Sitting Grooming Details (indicate cue type and reason): sitting EOB         Upper Body Dressing : Set up;Modified independent   Lower Body Dressing: Min guard;Set up Lower Body Dressing Details (indicate cue type and reason): mild decrease in balance when leaning forward at EOB.               General ADL Comments: Pt required min cues for bed mobility due to hip pain that she states is from the ambulance ride and extra time due to slow movements.  She used a RW with SBA to and from the toilet and min assist for LB dressing skills for balance sitting EOB.     Vision Baseline Vision/History: No visual deficits       Perception     Praxis      Pertinent Vitals/Pain Pain Assessment: No/denies pain  Hand Dominance Right   Extremity/Trunk Assessment Upper Extremity Assessment Upper Extremity Assessment: Overall WFL for tasks assessed   Lower Extremity Assessment Lower Extremity Assessment: Overall WFL for tasks assessed   Cervical / Trunk Assessment Cervical / Trunk Assessment: Normal   Communication Communication Communication: No difficulties   Cognition Arousal/Alertness: Awake/alert Behavior During Therapy: WFL for tasks  assessed/performed Overall Cognitive Status: Within Functional Limits for tasks assessed                                 General Comments: Mild delay in processing speed but intact for orientation, address and phone number.  Able to follow simple commands and is mildly hard of hearing.   General Comments       Exercises     Shoulder Instructions      Home Living Family/patient expects to be discharged to:: Private residence Living Arrangements: Children Available Help at Discharge: Family Type of Home: House Home Access: Stairs to enter Technical brewer of Steps: 3 Entrance Stairs-Rails: Left Home Layout: One level     Bathroom Shower/Tub: Tub/shower unit;Curtain         Home Equipment: [place] - single point;Shower seat;Grab bars - toilet;Hand held shower head;Tub bench   Additional Comments: Pt has a transfer tub bench with a bar      Prior Functioning/Environment Level of Independence: Independent with assistive device(s)        Comments: Ambulation with RW baseline; her son Megan Rodgers who is retired lives with her and does the laundry, trash and mows the grass.        OT Problem List: Decreased strength;Decreased activity tolerance;Impaired balance (sitting and/or standing)      OT Treatment/Interventions: Self-care/ADL training;Therapeutic activities;Balance training;Patient/family education    OT Goals(Current goals can be found in the care plan section) Acute Rehab OT Goals Patient Stated Goal: to go home OT Goal Formulation: With patient Time For Goal Achievement: 02/18/19 Potential to Achieve Goals: Good ADL Goals Pt Will Perform Grooming: with set-up;sitting;with min guard assist Pt Will Perform Lower Body Dressing: with set-up;with min guard assist;sit to/from stand Pt Will Transfer to Toilet: with supervision;stand pivot transfer;regular height toilet  OT Frequency: Min 1X/week   Barriers to D/C:            Co-evaluation               AM-PAC OT "6 Clicks" Daily Activity     Outcome Measure Help from another person eating meals?: A Little Help from another person taking care of personal grooming?: A Little Help from another person toileting, which includes using toliet, bedpan, or urinal?: A Little Help from another person bathing (including washing, rinsing, drying)?: A Little Help from another person to put on and taking off regular upper body clothing?: A Little Help from another person to put on and taking off regular lower body clothing?: A Little 6 Click Score: 18   End of Session Equipment Utilized During Treatment: Gait belt  Activity Tolerance: Patient tolerated treatment well Patient left: in bed;with call bell/phone within reach;with bed alarm set  OT Visit Diagnosis: Unsteadiness on feet (R26.81);Muscle weakness (generalized) (M62.81)                Time: 1515-1600 OT Time Calculation (min): 45 min Charges:  OT General Charges $OT Visit: 1 Visit OT Evaluation $OT Eval Low Complexity: 1 Low OT Treatments $Self Care/Home Management : 23-37 mins  Manuela Schwartz  Darlisa Spruiell, OTR/L, [place] [phone] 02/04/19, 4:30 PM

## 2019-02-04 NOTE — Progress Notes (Signed)
*  PRELIMINARY RESULTS* Echocardiogram 2D Echocardiogram has been performed.  Megan Rodgers 02/04/2019, 9:20 AM

## 2019-02-04 NOTE — Progress Notes (Signed)
PT Cancellation Note  Patient Details Name: Megan Rodgers MRN: [id number] DOB: 1929-06-10   Cancelled Treatment:    Reason Eval/Treat Not Completed: Other (comment) Order received and chart reviewed. Pt sitting on EOB when PT went in the room and pt's daughter is on the phone. The pt request this writer to talk to her daughter. This Probation officer introduced herself and pt's daughter reports concerns related to her mother's medication and change of cognitive level. HIPAA maintained (no pt information given to pt's daughter). PT then reported this information to pt's nurse. Pt's nurse reassessed pt and reports no change of status this morning. PT will re-attempt the PT session later when appropriate.    Sherrilyn Rist, SPT 02/04/2019, 11:25 AM

## 2019-02-04 NOTE — Progress Notes (Signed)
OT Cancellation Note  Patient Details Name: Megan Rodgers MRN: [id number] DOB: Oct 05, 1928   Cancelled Treatment:    Reason Eval/Treat Not Completed: Patient at procedure or test/ unavailable  Attempted to see patient 2 more times and she was not available due to Dr Doy Mince with patient and then at ECG.  Will attempt one more time later today.    Chrys Racer, OTR/L, [facility] ascom [phone] 02/04/19, 1:25 PM

## 2019-02-04 NOTE — Progress Notes (Signed)
OT Cancellation Note  Patient Details Name: Megan Rodgers MRN: [id number] DOB: [birth year]   Cancelled Treatment:    Reason Eval/Treat Not Completed: Patient at procedure or test/ unavailable Order received and chart reviewed.  Pt having ECHO done and not available at this time for OT assessment.  Will attempt again later today.  Thank you for the referral.  Chrys Racer, OTR/L, [facility] ascom [phone] 02/04/19, 8:53 AM

## 2019-02-04 NOTE — Progress Notes (Addendum)
SLP Cancellation Note  Patient Details Name: Megan Rodgers MRN: [id number] DOB: 1928-12-08   Cancelled treatment:       Reason Eval/Treat Not Completed: Patient at procedure or test/unavailable(chart reviewed; pt w/ another discipline/session). NSG reported pt's speech is fully intelligible; she is able to verbalize wants/needs. This morning pt was experiencing min confusion; NSG reported pt seemed back to baseline. Reviewed Neurology note. (Addendum: Neurology noting possible finding of Dementia among other possibilities per EEG --- pt is already on Aricept Baseline per chart). ST services will f/u w/ cognitive-linguistic assessment as indicated later today or tomorrow. NSG agreed.      Orinda Kenner, [place], CCC-SLP Watson,Katherine 02/04/2019, 12:19 PM

## 2019-02-04 NOTE — Consult Note (Signed)
Referring Physician: Pyreddy    Chief Complaint: Confusion, left arm numbness  HPI: Megan Rodgers is an 85 y.o. female with a history of atrial fibrillation on Pradaxa who reports that on yesterday the patient was talking to her daughter on the phone when she acutely became confused per report.  EMS was called.  Symptoms lasted about 30 minutes before resolving spontaneously.  Initial NIHSS of 2.    Date last known well: Date: 02/03/2019 Time last known well: Time: 12:15 tPA Given: No: Resolution of symptoms, on Pradaxa and compliant  Past Medical History:  Diagnosis Date  . Atrial fibrillation ([place])   . Cancer ([place])    skin ca  . Hypertension   . Thyroid disease   . Tremors of nervous system     Past Surgical History:  Procedure Laterality Date  . ABDOMINAL HYSTERECTOMY    . BREAST CYST ASPIRATION Left    lt fna-neg  . CARDIAC ELECTROPHYSIOLOGY STUDY AND ABLATION    . PACEMAKER INSERTION    . TONSILLECTOMY      Family History  Problem Relation Age of Onset  . Hypertension Mother   . Breast cancer Neg Hx    Social History:  reports that she has never smoked. She has never used smokeless tobacco. She reports that she does not drink alcohol or use drugs.  Allergies:  Allergies  Allergen Reactions  . Penicillins Anaphylaxis    Has patient had a PCN reaction causing immediate rash, facial/tongue/throat swelling, SOB or lightheadedness with hypotension: Yes Has patient had a PCN reaction causing severe rash involving mucus membranes or skin necrosis: No Has patient had a PCN reaction that required hospitalization: No Has patient had a PCN reaction occurring within the last 10 years: Unknown If all of the above answers are "NO", then may proceed with Cephalosporin use.   . Codeine   . Iodinated Diagnostic Agents   . Metronidazole   . Shellfish-Derived Products   . Tetracyclines & Related     Medications:  I have reviewed the patient's current medications. Prior to  Admission:  Medications Prior to Admission  Medication Sig Dispense Refill Last Dose  . alendronate (FOSAMAX) 70 MG tablet Take 70 mg by mouth once a week.   Past Week at prn  . Cholecalciferol (VITAMIN D-3) 1000 units CAPS Take 1 capsule (1,000 Units total) by mouth daily. 60 capsule  02/03/2019 at prn  . citalopram (CELEXA) 10 MG tablet Take 5 mg by mouth daily.   02/03/2019 at prn  . dabigatran (PRADAXA) 150 MG CAPS capsule Take 150 mg by mouth 2 (two) times daily.   02/03/2019 at prn  . donepezil (ARICEPT) 5 MG tablet Take 5 mg by mouth at bedtime.   02/02/2019 at prn  . estradiol (ESTRACE) 0.1 MG/GM vaginal cream Place 1 Applicatorful vaginally at bedtime.   Past Week at prn  . LACTOBACILLUS PO Take by mouth.   02/03/2019 at prn  . levothyroxine (SYNTHROID) 75 MCG tablet Take 75 mcg by mouth daily. Take on an empty stomach with a glass of water at least 30-60 minutes before breakfast   02/03/2019 at prn  . Multiple Vitamin (MULTIVITAMIN) capsule Take 1 capsule by mouth daily.   02/03/2019 at prn  . primidone (MYSOLINE) 50 MG tablet Take 50 mg by mouth 4 (four) times daily.   02/03/2019 at prn  . propranolol (INDERAL) 60 MG tablet Take 60 mg by mouth daily.   02/03/2019 at prn  . cephALEXin (KEFLEX) 500 MG  capsule Take 1 capsule (500 mg total) by mouth every 12 (twelve) hours. (Patient not taking: Reported on 02/03/2019) 8 capsule 0 Completed Course at prn  . fluticasone (FLONASE) 50 MCG/ACT nasal spray Place into both nostrils daily.   unknown at prn  . naphazoline-glycerin (CLEAR EYES REDNESS) 0.012-0.2 % SOLN Apply to eye.   unknown at prn  . triamcinolone (KENALOG) 0.025 % cream Apply 1 application topically 2 (two) times daily.   unknown at prn   Scheduled: . aspirin  300 mg Rectal Daily   Or  . aspirin  325 mg Oral Daily  . citalopram  5 mg Oral Daily  . dabigatran  150 mg Oral BID  . donepezil  5 mg Oral QHS  . estradiol  1 Applicatorful Vaginal QHS  . fluticasone  1 spray Each Nare Daily   . levothyroxine  75 mcg Oral Daily  . primidone  50 mg Oral QID  . propranolol  60 mg Oral Daily    ROS: History obtained from the patient  General ROS: negative for - chills, fatigue, fever, night sweats, weight gain or weight loss Psychological ROS: negative for - behavioral disorder, hallucinations, memory difficulties, mood swings or suicidal ideation Ophthalmic ROS: negative for - blurry vision, double vision, eye pain or loss of vision ENT ROS: negative for - epistaxis, nasal discharge, oral lesions, sore throat, tinnitus or vertigo Allergy and Immunology ROS: negative for - hives or itchy/watery eyes Hematological and Lymphatic ROS: negative for - bleeding problems, bruising or swollen lymph nodes Endocrine ROS: negative for - galactorrhea, hair pattern changes, polydipsia/polyuria or temperature intolerance Respiratory ROS: negative for - cough, hemoptysis, shortness of breath or wheezing Cardiovascular ROS: negative for - chest pain, dyspnea on exertion, edema or irregular heartbeat Gastrointestinal ROS: negative for - abdominal pain, diarrhea, hematemesis, nausea/vomiting or stool incontinence Genito-Urinary ROS: negative for - dysuria, hematuria, incontinence or urinary frequency/urgency Musculoskeletal ROS: negative for - joint swelling or muscular weakness Neurological ROS: as noted in HPI, tremors Dermatological ROS: negative for rash and skin lesion changes  Physical Examination: Blood pressure 139/61, pulse 73, temperature (!) 97.4 F (36.3 C), temperature source Oral, resp. rate 20, height 5\' 5"  (1.651 m), weight 57.9 kg, SpO2 98 %.  HEENT-  Normocephalic, no lesions, without obvious abnormality.  Normal external eye and conjunctiva.  Normal TM's bilaterally.  Normal auditory canals and external ears. Normal external nose, mucus membranes and septum.  Normal pharynx. Cardiovascular- S1, S2 normal, pulses palpable throughout   Lungs- chest clear, no wheezing, rales,  normal symmetric air entry Abdomen- soft, non-tender; bowel sounds normal; no masses,  no organomegaly Extremities- no edema Lymph-no adenopathy palpable Musculoskeletal-no joint tenderness, deformity or swelling Skin-warm and dry, no hyperpigmentation, vitiligo, or suspicious lesions  Neurological Examination   Mental Status: Alert, thought content appropriate.  Speech fluent without evidence of aphasia.  Able to follow 3 step commands without difficulty. Cranial Nerves: II: Discs flat bilaterally; Visual fields grossly normal, pupils equal, round, reactive to light and accommodation III,IV, VI: ptosis not present, extra-ocular motions intact bilaterally V,VII: smile symmetric, facial light touch sensation normal bilaterally VIII: hearing normal bilaterally IX,X: gag reflex present XI: bilateral shoulder shrug XII: midline tongue extension Motor: Right : Upper extremity   5/5    Left:     Upper extremity   5/5  Lower extremity   5/5     Lower extremity   5/5 Tremor noted throughout Sensory: Pinprick and light touch intact throughout, bilaterally Deep Tendon Reflexes:  Symmetric throughout Plantars: Right: mute   Left: mute Cerebellar: Normal finger-to-nose and normal heel-to-shin testing bilaterally Gait: not tested due to safety concerns    Laboratory Studies:  Basic Metabolic Panel: Recent Labs  Lab 02/03/19 1349  NA 134*  K 4.9  CL 101  CO2 25  GLUCOSE 107*  BUN 16  CREATININE 0.77  CALCIUM 9.3    Liver Function Tests: Recent Labs  Lab 02/03/19 1349  AST 28  ALT 8  ALKPHOS 75  BILITOT 0.9  PROT 6.7  ALBUMIN 3.9   No results for input(s): LIPASE, AMYLASE in the last 168 hours. No results for input(s): AMMONIA in the last 168 hours.  CBC: Recent Labs  Lab 02/03/19 1349  WBC 4.6  HGB 13.2  HCT 40.0  MCV 97.1  PLT 169    Cardiac Enzymes: No results for input(s): CKTOTAL, CKMB, CKMBINDEX, TROPONINI in the last 168 hours.  BNP: Invalid input(s):  POCBNP  CBG: No results for input(s): GLUCAP in the last 168 hours.  Microbiology: Results for orders placed or performed during the hospital encounter of 02/03/19  SARS CORONAVIRUS 2 Nasal Swab Aptima Multi Swab     Status: None   Collection Time: 02/03/19  1:49 PM   Specimen: Aptima Multi Swab; Nasal Swab  Result Value Ref Range Status   SARS Coronavirus 2 NEGATIVE NEGATIVE Final    Comment: (NOTE) SARS-CoV-2 target nucleic acids are NOT DETECTED. The SARS-CoV-2 RNA is generally detectable in upper and lower respiratory specimens during the acute phase of infection. Negative results do not preclude SARS-CoV-2 infection, do not rule out co-infections with other pathogens, and should not be used as the sole basis for treatment or other patient management decisions. Negative results must be combined with clinical observations, patient history, and epidemiological information. The expected result is Negative. Fact Sheet for Patients: SugarRoll.be Fact Sheet for Healthcare Providers: https://www.woods-mathews.com/ This test is not yet approved or cleared by the [place] FDA and  has been authorized for detection and/or diagnosis of SARS-CoV-2 by FDA under an Emergency Use Authorization (EUA). This EUA will remain  in effect (meaning this test can be used) for the duration of the COVID-19 declaration under Section 56 4(b)(1) of the Act, 21 U.S.C. section 360bbb-3(b)(1), unless the authorization is terminated or revoked sooner. Performed at [facility], [place] [address], [place], [place] [address]     Coagulation Studies: No results for input(s): LABPROT, INR in the last 72 hours.  Urinalysis:  Recent Labs  Lab 02/03/19 1557  COLORURINE YELLOW*  LABSPEC 1.011  PHURINE 6.0  GLUCOSEU NEGATIVE  HGBUR LARGE*  BILIRUBINUR NEGATIVE  KETONESUR NEGATIVE  PROTEINUR NEGATIVE  NITRITE POSITIVE*  LEUKOCYTESUR SMALL*    Lipid  Panel:    Component Value Date/Time   CHOL 205 (H) 02/04/2019 0459   TRIG 66 02/04/2019 0459   HDL 57 02/04/2019 0459   CHOLHDL 3.6 02/04/2019 0459   VLDL 13 02/04/2019 0459   LDLCALC 135 (H) 02/04/2019 0459    HgbA1C:  Lab Results  Component Value Date   HGBA1C 5.8 (H) 02/04/2019    Urine Drug Screen:  No results found for: LABOPIA, COCAINSCRNUR, LABBENZ, AMPHETMU, THCU, LABBARB  Alcohol Level: No results for input(s): ETH in the last 168 hours.  Other results: EKG: afib, ventricular paced at 77 bpm.  Imaging: Ct Head Wo Contrast  Result Date: 02/03/2019 CLINICAL DATA:  Confusion, acute EXAM: CT HEAD WITHOUT CONTRAST TECHNIQUE: Contiguous axial images were obtained from the base  of the skull through the vertex without intravenous contrast. COMPARISON:  September 26, 2018 FINDINGS: Brain: No evidence of acute territorial infarction, hemorrhage, hydrocephalus,extra-axial collection or mass lesion/mass effect. There is mild dilatation the ventricles and sulci consistent with age-related atrophy. Low-attenuation changes in the deep white matter consistent with small vessel ischemia. Vascular: No hyperdense vessel or unexpected calcification. Skull: The skull is intact. No fracture or focal lesion identified. Sinuses/Orbits: The visualized paranasal sinuses and mastoid air cells are clear. The orbits and globes intact. Other: None IMPRESSION: No acute intracranial abnormality. Findings consistent with mild age related atrophy and chronic small vessel ischemia Electronically Signed   By: Prudencio Pair M.D.   On: 02/03/2019 16:22   US Carotid Bilateral (at [facility] Only)  Result Date: 02/04/2019 CLINICAL DATA:  85 year old female with symptoms of transient ischemic attack EXAM: BILATERAL CAROTID DUPLEX ULTRASOUND TECHNIQUE: Pearline Cables scale imaging, color Doppler and duplex ultrasound were performed of bilateral carotid and vertebral arteries in the neck. COMPARISON:  Prior carotid duplex ultrasound  07/30/2008 FINDINGS: Criteria: Quantification of carotid stenosis is based on velocity parameters that correlate the residual internal carotid diameter with NASCET-based stenosis levels, using the diameter of the distal internal carotid lumen as the denominator for stenosis measurement. The following velocity measurements were obtained: RIGHT ICA: 64/16 cm/sec CCA: 82/4 cm/sec SYSTOLIC ICA/CCA RATIO:  1.2 ECA:  42 cm/sec LEFT ICA: 55/10 cm/sec CCA: 23/5 cm/sec SYSTOLIC ICA/CCA RATIO:  1.1 ECA:  55 cm/sec RIGHT CAROTID ARTERY: Trace heterogeneous atherosclerotic plaque in the proximal internal carotid artery. By peak systolic velocity criteria, the estimated stenosis is less than 50%. RIGHT VERTEBRAL ARTERY:  Patent with normal antegrade flow. LEFT CAROTID ARTERY: Heterogeneous atherosclerotic plaque in the proximal internal carotid artery. By peak systolic velocity criteria, the estimated stenosis is less than 50%. LEFT VERTEBRAL ARTERY:  Patent with normal antegrade flow. IMPRESSION: 1. Mild (1-49%) stenosis proximal right internal carotid artery secondary to mild heterogeneous atherosclerotic plaque. 2. Mild (1-49%) stenosis proximal left internal carotid artery secondary to mild heterogeneous atherosclerotic plaque. 3. The vertebral arteries are patent with normal antegrade flow. Signed, Criselda Peaches, MD, [facility] Radiology Electronically Signed   By: Jacqulynn Cadet M.D.   On: 02/04/2019 08:37    Assessment: 85 y.o. female with a history of afib on Pradaxa who presents with an episode of confusion and left arm numbness.  Symptoms now resolved.  TIA likely.  Head CT reviewed and shows no acute changes.  MRI unable to be performed due to pacemaker.  Patient compliant with Pradaxa.  Carotid dopplers show no evidence of hemodynamically significant stenosis.  Echocardiogram pending.  A1c 5.8, LDL 135.  Stroke Risk Factors - atrial fibrillation and  hypertension  Plan: 1. PT consult, OT consult, Speech consult 2. Prophylactic therapy-Continue Pradaxa 3. Telemetry monitoring 4. Frequent neuro checks 5. Statin for lipid management with target LDL<70. 6. EEG   Alexis Goodell, MD Neurology [phone] 02/04/2019, 10:24 AM

## 2019-02-04 NOTE — Evaluation (Signed)
Physical Therapy Evaluation Patient Details Name: Megan Rodgers MRN: [id number] DOB: [birth year] Today's Date: 02/04/2019   History of Present Illness  85 y.o. female with a past medical history of hypertension, hyponatremia, presents to the emergency department for confusion. Dx: TIA/stroke work up CT: No acute intracranial abnormality  Clinical Impression  Prior to hospital admission, pt was modified independent with RW.  Pt lives in a one story house with 3 steps to the door and L side railing. Nurse communicated and cleared PT to proceed the session with pt. Currently pt is modified independent with bed mobility from supine to sitting on EOB. SBA with standing from sitting on EOB with RW. +1 SBA ambulation with RW 200 feet with RW. Vc's provided for RW management as well as heavy cueing for maintaining body positioning inside of the RW. Unable to maintain body positioning without cueing. Needs reinforcement. Pt requires increasing processing time to answer questions. Sensation/proprioception/coordination/tone assessed and intact. BUE shakiness presents but pt states this is her baseline. Pt would benefit from skilled PT to address noted impairments and functional limitations (see below for any additional details).  Upon hospital discharge, recommend pt discharge to HHPT to improve gait, balance, and strength to improve functional mobility.    Follow Up Recommendations Home health PT;Supervision/Assistance - 24 hour    Equipment Recommendations  None recommended by PT    Recommendations for Other Services       Precautions / Restrictions Precautions Precautions: Fall Restrictions Weight Bearing Restrictions: No      Mobility  Bed Mobility Overal bed mobility: Modified Independent             General bed mobility comments: increase time and effort  Transfers Overall transfer level: Needs assistance Equipment used: Rolling walker (2 wheeled) Transfers: Sit to/from  Stand Sit to Stand: Supervision         General transfer comment: vc's provided provided for hand and body positioning with RW management  Ambulation/Gait Ambulation/Gait assistance: Supervision Gait Distance (Feet): 200 Feet Assistive device: Rolling walker (2 wheeled) Gait Pattern/deviations: Step-through pattern Gait velocity: decrease   General Gait Details: Heavy cueing to maintain body positioning inside of the RW.  Stairs            Wheelchair Mobility    Modified Rankin (Stroke Patients Only)       Balance Overall balance assessment: Needs assistance Sitting-balance support: No upper extremity supported;Feet supported Sitting balance-Leahy Scale: Good Sitting balance - Comments: steady sitting and reaching within BOS   Standing balance support: Single extremity supported Standing balance-Leahy Scale: Fair Standing balance comment: requires at least one UE support on RW to maintian balance.                             Pertinent Vitals/Pain Pain Assessment: No/denies pain    Home Living Family/patient expects to be discharged to:: Private residence Living Arrangements: Children Available Help at Discharge: Family Type of Home: House Home Access: Stairs to enter Entrance Stairs-Rails: Left Entrance Stairs-Number of Steps: 3 Home Layout: One level Home Equipment: Cane - single point;Shower seat;Grab bars - toilet;Hand held shower head      Prior Function Level of Independence: Independent with assistive device(s)         Comments: Ambulation with RW baseline     Hand Dominance   Dominant Hand: Right    Extremity/Trunk Assessment   Upper Extremity Assessment Upper Extremity Assessment: Overall WFL for tasks  assessed(at least 4/5 with BUE shoulder flexion, elbow flexion/extension, and strong grip strength)    Lower Extremity Assessment Lower Extremity Assessment: Overall WFL for tasks assessed(At least 4/5 with LE R hip  flexion, L knee flexion/extension, R knee extension and BLE dorsiflexion/plantar flexion. ( 4-/5 R knee flexion, 4-/5 L hip flexion))       Communication   Communication: No difficulties  Cognition Arousal/Alertness: Awake/alert Behavior During Therapy: WFL for tasks assessed/performed Overall Cognitive Status: Within Functional Limits for tasks assessed                                        General Comments      Exercises     Assessment/Plan    PT Assessment Patient needs continued PT services  PT Problem List Decreased strength;Decreased activity tolerance;Decreased balance;Decreased mobility;Decreased knowledge of use of DME       PT Treatment Interventions Gait training;Stair training;DME instruction;Functional mobility training;Balance training;Therapeutic exercise;Therapeutic activities    PT Goals (Current goals can be found in the Care Plan section)  Acute Rehab PT Goals Patient Stated Goal: to go home PT Goal Formulation: With patient Time For Goal Achievement: 02/18/19 Potential to Achieve Goals: Good    Frequency 7X/week   Barriers to discharge        Co-evaluation               AM-PAC PT "6 Clicks" Mobility  Outcome Measure Help needed turning from your back to your side while in a flat bed without using bedrails?: None Help needed moving from lying on your back to sitting on the side of a flat bed without using bedrails?: None Help needed moving to and from a bed to a chair (including a wheelchair)?: A Little Help needed standing up from a chair using your arms (e.g., wheelchair or bedside chair)?: A Little Help needed to walk in hospital room?: A Little Help needed climbing 3-5 steps with a railing? : A Little 6 Click Score: 20    End of Session Equipment Utilized During Treatment: Gait belt Activity Tolerance: Patient tolerated treatment well Patient left: in chair;with call bell/phone within reach;with chair alarm  set Nurse Communication: Mobility status;Precautions;Weight bearing status PT Visit Diagnosis: Unsteadiness on feet (R26.81);Other abnormalities of gait and mobility (R26.89);Repeated falls (R29.6);History of falling (Z91.81);Muscle weakness (generalized) (M62.81);Difficulty in walking, not elsewhere classified (R26.2)    Time: 1135-1209 PT Time Calculation (min) (ACUTE ONLY): 34 min   Charges:                Sherrilyn Rist, SPT 02/04/2019, 1:16 PM

## 2019-02-05 LAB — URINE CULTURE: Culture: >=100000 — AB

## 2019-02-05 MED ORDER — CIPROFLOXACIN HCL 500 MG PO TABS: 500.0000 mg | ORAL_TABLET | Freq: Two times a day (BID) | ORAL | 0 refills | Status: AC

## 2019-02-05 MED ORDER — PRIMIDONE 50 MG PO TABS
50.0000 mg | ORAL_TABLET | Freq: Every day | ORAL | Status: DC
  Filled 2019-02-05: qty 1

## 2019-02-05 MED ORDER — PRIMIDONE 50 MG PO TABS
100.0000 mg | ORAL_TABLET | Freq: Two times a day (BID) | ORAL | Status: DC
  Filled 2019-02-05 (×3): qty 2

## 2019-02-05 MED ORDER — CIPROFLOXACIN IN D5W 400 MG/200ML IV SOLN
400.0000 mg | Freq: Once | INTRAVENOUS | Status: AC
  Administered 2019-02-05: 13:00:00 400 mg via INTRAVENOUS
  Filled 2019-02-05: qty 200

## 2019-02-05 NOTE — Discharge Summary (Signed)
[facility] [place] NAME: Megan Rodgers    MR#:  [id number]  DATE OF BIRTH:  08/19/1928  DATE OF ADMISSION:  02/03/2019 ADMITTING PHYSICIAN: Megan Mormon, MD  DATE OF DISCHARGE: 02/05/2019  PRIMARY Rodgers PHYSICIAN: Megan Rodgers, Megan Rodgers   ADMISSION DIAGNOSIS:  TIA (transient ischemic attack) [G45.9] Lower urinary tract infectious disease [N39.0]  DISCHARGE DIAGNOSIS:  Active Problems:   TIA (transient ischemic attack) Urinary tract infection Chronic atrial fibrillation Hypertension  SECONDARY DIAGNOSIS:   Past Medical History:  Diagnosis Date  . Atrial fibrillation ([place])   . Cancer ([place])    skin ca  . Hypertension   . Thyroid disease   . Tremors of nervous system      ADMITTING HISTORY Megan Rodgers  is a 86 y.o. female with a known history of atrial fibrillation, hypothyroidism, hypertension, and tremor.  She presented to the emergency room via EMS service who reported she had been on the telephone with her daughter when she became acutely confused.  At this time the patient was also complaining of left arm and hand numbness with tingling.  Numbness and tingling of her left upper extremity have resolved prior to arrival to the emergency room.  Patient was found to be awake alert and oriented x4 upon arrival to the emergency room and upon my visit as well.  Patient tells me she lives with her son.  She denies dysuria.  She has noted dark urine however no foul urine odor, urgency, or frequency.  She denies fever, chills, nausea, vomiting, chest pain, or shortness of breath.  She denies cough.  CT brain was completed on arrival demonstrating no acute intracranial abnormalities.  NIH score is 0.  Urinalysis demonstrates small amount leukocytes, many bacteria, and 11-20 WBCs as well as positive nitrites.  She was placed on fosfomycin given her severe penicillin allergy and slight reaction upon administration of Levaquin in the  emergency room.  We have admitted her to the hospitalist service for further evaluation and management.  HOSPITAL COURSE:   Patient was admitted to medical floor.  Patient worked up with CT head which showed no acute abnormality.  MRI brain could not be done as patient has pacemaker.  She continued anticoagulation with Pradaxa for atrial fibrillation.  Patient received Rocephin antibiotic and switched to IV ciprofloxacin antibiotic based on culture and sensitivities for UTI.  Patient was evaluated by neurology during hospitalization.  Patient was worked up with carotid ultrasound and echocardiogram.  Carotid ultrasound did not show any significant stenosis.  Urine culture grew E. coli and Klebsiella sensitive to ciprofloxacin.  Patient will be discharged home on oral ciprofloxacin antibiotic with home health services.  Echocardiogram showed EF of 55 to 60%. CONSULTS OBTAINED:  Treatment Team:  Megan Hartshorn, MD  DRUG ALLERGIES:   Allergies  Allergen Reactions  . Penicillins Anaphylaxis    Has patient had a PCN reaction causing immediate rash, facial/tongue/throat swelling, SOB or lightheadedness with hypotension: Yes Has patient had a PCN reaction causing severe rash involving mucus membranes or skin necrosis: No Has patient had a PCN reaction that required hospitalization: No Has patient had a PCN reaction occurring within the last 10 years: Unknown If all of the above answers are "NO", then may proceed with Cephalosporin use.   . Codeine   . Iodinated Diagnostic Agents   . Metronidazole   . Shellfish-Derived Products   . Tetracyclines & Related     DISCHARGE MEDICATIONS:  Allergies as of 02/05/2019      Reactions   Penicillins Anaphylaxis   Has patient had a PCN reaction causing immediate rash, facial/tongue/throat swelling, SOB or lightheadedness with hypotension: Yes Has patient had a PCN reaction causing severe rash involving mucus membranes or skin necrosis: No Has patient  had a PCN reaction that required hospitalization: No Has patient had a PCN reaction occurring within the last 10 years: Unknown If all of the above answers are "NO", then may proceed with Cephalosporin use.   Codeine    Iodinated Diagnostic Agents    Metronidazole    Shellfish-derived Products    Tetracyclines & Related       Medication List    STOP taking these medications   cephALEXin 500 MG capsule Commonly known as: KEFLEX     TAKE these medications   alendronate 70 MG tablet Commonly known as: FOSAMAX Take 70 mg by mouth once a week.   ciprofloxacin 500 MG tablet Commonly known as: Cipro Take 1 tablet (500 mg total) by mouth 2 (two) times daily for 5 days.   citalopram 10 MG tablet Commonly known as: CELEXA Take 5 mg by mouth daily.   dabigatran 150 MG Caps capsule Commonly known as: PRADAXA Take 150 mg by mouth 2 (two) times daily.   donepezil 5 MG tablet Commonly known as: ARICEPT Take 5 mg by mouth at bedtime.   estradiol 0.1 MG/GM vaginal cream Commonly known as: ESTRACE Place 1 Applicatorful vaginally at bedtime.   fluticasone 50 MCG/ACT nasal spray Commonly known as: FLONASE Place into both nostrils daily.   LACTOBACILLUS PO Take by mouth.   levothyroxine 75 MCG tablet Commonly known as: SYNTHROID Take 75 mcg by mouth daily. Take on an empty stomach with a glass of water at least 30-60 minutes before breakfast   multivitamin capsule Take 1 capsule by mouth daily.   naphazoline-glycerin 0.012-0.2 % Soln Commonly known as: CLEAR EYES REDNESS Apply to eye.   primidone 50 MG tablet Commonly known as: MYSOLINE Take 50 mg by mouth 4 (four) times daily.   propranolol 60 MG tablet Commonly known as: INDERAL Take 60 mg by mouth daily.   triamcinolone 0.025 % cream Commonly known as: KENALOG Apply 1 application topically 2 (two) times daily.   Vitamin D-3 25 MCG (1000 UT) Caps Take 1 capsule (1,000 Units total) by mouth daily.       Today   Patient seen today Tolerating diet well No weakness or numbness Hemodynamically stable VITAL SIGNS:  Blood pressure (!) 147/77, pulse 75, temperature (!) 97.3 F (36.3 C), temperature source Oral, resp. rate 17, height 5\' 5"  (1.651 m), weight 57.9 kg, SpO2 99 %.  I/O:    Intake/Output Summary (Last 24 hours) at 02/05/2019 1348 Last data filed at 02/05/2019 0334 Gross per 24 hour  Intake 1083.2 ml  Output -  Net 1083.2 ml    PHYSICAL EXAMINATION:  Physical Exam  GENERAL:  85 y.o.-year-old patient lying in the bed with no acute distress.  LUNGS: Normal breath sounds bilaterally, no wheezing, rales,rhonchi or crepitation. No use of accessory muscles of respiration.  CARDIOVASCULAR: S1, S2 irregular. No murmurs, rubs, or gallops.  ABDOMEN: Soft, non-tender, non-distended. Bowel sounds present. No organomegaly or mass.  NEUROLOGIC: Moves all 4 extremities. PSYCHIATRIC: The patient is alert and oriented x 3.  SKIN: No obvious rash, lesion, or ulcer.   DATA REVIEW:   CBC Recent Labs  Lab 02/03/19 1349  WBC 4.6  HGB 13.2  HCT  40.0  PLT 169    Chemistries  Recent Labs  Lab 02/03/19 1349  NA 134*  K 4.9  CL 101  CO2 25  GLUCOSE 107*  BUN 16  CREATININE 0.77  CALCIUM 9.3  AST 28  ALT 8  ALKPHOS 75  BILITOT 0.9    Cardiac Enzymes No results for input(s): TROPONINI in the last 168 hours.  Microbiology Results  Results for orders placed or performed during the hospital encounter of 02/03/19  SARS CORONAVIRUS 2 Nasal Swab Aptima Multi Swab     Status: None   Collection Time: 02/03/19  1:49 PM   Specimen: Aptima Multi Swab; Nasal Swab  Result Value Ref Range Status   SARS Coronavirus 2 NEGATIVE NEGATIVE Final    Comment: (NOTE) SARS-CoV-2 target nucleic acids are NOT DETECTED. The SARS-CoV-2 RNA is generally detectable in upper and lower respiratory specimens during the acute phase of infection. Negative results do not preclude SARS-CoV-2 infection, do not  rule out co-infections with other pathogens, and should not be used as the sole basis for treatment or other patient management decisions. Negative results must be combined with clinical observations, patient history, and epidemiological information. The expected result is Negative. Fact Sheet for Patients: SugarRoll.be Fact Sheet for Healthcare Providers: https://www.woods-mathews.com/ This test is not yet approved or cleared by the [place] FDA and  has been authorized for detection and/or diagnosis of SARS-CoV-2 by FDA under an Emergency Use Authorization (EUA). This EUA will remain  in effect (meaning this test can be used) for the duration of the COVID-19 declaration under Section 56 4(b)(1) of the Act, 21 U.S.C. section 360bbb-3(b)(1), unless the authorization is terminated or revoked sooner. Performed at [facility], [place] [address], [place], [place] [address]   Urine Culture     Status: Abnormal   Collection Time: 02/03/19  3:57 PM   Specimen: Urine, Random  Result Value Ref Range Status   Specimen Description   Final    URINE, RANDOM Performed at [facility], [address] [place], [place] [address]    Special Requests   Final    NONE Performed at [facility], [address] [place], [place] [address]    Culture (A)  Final    >=100,000 COLONIES/mL KLEBSIELLA PNEUMONIAE 90,000 COLONIES/mL ESCHERICHIA COLI    Report Status 02/05/2019 FINAL  Final   Organism ID, Bacteria KLEBSIELLA PNEUMONIAE (A)  Final   Organism ID, Bacteria ESCHERICHIA COLI (A)  Final      Susceptibility   Escherichia coli - MIC*    AMPICILLIN <=2 SENSITIVE Sensitive     CEFAZOLIN <=4 SENSITIVE Sensitive     CEFTRIAXONE <=1 SENSITIVE Sensitive     CIPROFLOXACIN <=0.25 SENSITIVE Sensitive     GENTAMICIN <=1 SENSITIVE Sensitive     IMIPENEM <=0.25 SENSITIVE Sensitive     NITROFURANTOIN <=16 SENSITIVE Sensitive      TRIMETH/SULFA <=20 SENSITIVE Sensitive     AMPICILLIN/SULBACTAM <=2 SENSITIVE Sensitive     PIP/TAZO <=4 SENSITIVE Sensitive     Extended ESBL NEGATIVE Sensitive     * 90,000 COLONIES/mL ESCHERICHIA COLI   Klebsiella pneumoniae - MIC*    AMPICILLIN RESISTANT Resistant     CEFAZOLIN <=4 SENSITIVE Sensitive     CEFTRIAXONE <=1 SENSITIVE Sensitive     CIPROFLOXACIN <=0.25 SENSITIVE Sensitive     GENTAMICIN <=1 SENSITIVE Sensitive     IMIPENEM <=0.25 SENSITIVE Sensitive     NITROFURANTOIN <=16 SENSITIVE Sensitive     TRIMETH/SULFA <=  20 SENSITIVE Sensitive     AMPICILLIN/SULBACTAM 4 SENSITIVE Sensitive     PIP/TAZO <=4 SENSITIVE Sensitive     Extended ESBL NEGATIVE Sensitive     * >=100,000 COLONIES/mL KLEBSIELLA PNEUMONIAE    RADIOLOGY:  Ct Head Wo Contrast  Result Date: 02/03/2019 CLINICAL DATA:  Confusion, acute EXAM: CT HEAD WITHOUT CONTRAST TECHNIQUE: Contiguous axial images were obtained from the base of the skull through the vertex without intravenous contrast. COMPARISON:  September 26, 2018 FINDINGS: Brain: No evidence of acute territorial infarction, hemorrhage, hydrocephalus,extra-axial collection or mass lesion/mass effect. There is mild dilatation the ventricles and sulci consistent with age-related atrophy. Low-attenuation changes in the deep white matter consistent with small vessel ischemia. Vascular: No hyperdense vessel or unexpected calcification. Skull: The skull is intact. No fracture or focal lesion identified. Sinuses/Orbits: The visualized paranasal sinuses and mastoid air cells are clear. The orbits and globes intact. Other: None IMPRESSION: No acute intracranial abnormality. Findings consistent with mild age related atrophy and chronic small vessel ischemia Electronically Signed   By: Prudencio Pair M.D.   On: 02/03/2019 16:22   US Carotid Bilateral (at [facility] Only)  Result Date: 02/04/2019 CLINICAL DATA:  85 year old female with symptoms of transient ischemic attack  EXAM: BILATERAL CAROTID DUPLEX ULTRASOUND TECHNIQUE: Pearline Cables scale imaging, color Doppler and duplex ultrasound were performed of bilateral carotid and vertebral arteries in the neck. COMPARISON:  Prior carotid duplex ultrasound 07/30/2008 FINDINGS: Criteria: Quantification of carotid stenosis is based on velocity parameters that correlate the residual internal carotid diameter with NASCET-based stenosis levels, using the diameter of the distal internal carotid lumen as the denominator for stenosis measurement. The following velocity measurements were obtained: RIGHT ICA: 64/16 cm/sec CCA: 76/8 cm/sec SYSTOLIC ICA/CCA RATIO:  1.2 ECA:  42 cm/sec LEFT ICA: 55/10 cm/sec CCA: 11/5 cm/sec SYSTOLIC ICA/CCA RATIO:  1.1 ECA:  55 cm/sec RIGHT CAROTID ARTERY: Trace heterogeneous atherosclerotic plaque in the proximal internal carotid artery. By peak systolic velocity criteria, the estimated stenosis is less than 50%. RIGHT VERTEBRAL ARTERY:  Patent with normal antegrade flow. LEFT CAROTID ARTERY: Heterogeneous atherosclerotic plaque in the proximal internal carotid artery. By peak systolic velocity criteria, the estimated stenosis is less than 50%. LEFT VERTEBRAL ARTERY:  Patent with normal antegrade flow. IMPRESSION: 1. Mild (1-49%) stenosis proximal right internal carotid artery secondary to mild heterogeneous atherosclerotic plaque. 2. Mild (1-49%) stenosis proximal left internal carotid artery secondary to mild heterogeneous atherosclerotic plaque. 3. The vertebral arteries are patent with normal antegrade flow. Signed, Criselda Peaches, MD, [facility] Radiology Electronically Signed   By: Jacqulynn Cadet M.D.   On: 02/04/2019 08:37    Follow up with PCP in 1 week.  Management plans discussed with the patient, family and they are in agreement.  CODE STATUS: DNR    Code Status Orders  (From admission, onward)         Start     Ordered   02/03/19 1902   Do not attempt resuscitation (DNR)  Continuous    Question Answer Comment  In the event of cardiac or respiratory ARREST Do not call a "code blue"   In the event of cardiac or respiratory ARREST Do not perform Intubation, CPR, defibrillation or ACLS   In the event of cardiac or respiratory ARREST Use medication by any route, position, wound Rodgers, and other measures to relive pain and suffering. May use oxygen, suction and manual treatment of airway obstruction as needed for comfort.  02/03/19 1901        Code Status History    Date Active Date Inactive Code Status Order ID Comments User Context   04/07/2018 2232 04/10/2018 2000 DNR [id number]  Vaughan Basta, MD Inpatient   Advance Rodgers Planning Activity      TOTAL TIME TAKING Rodgers OF THIS PATIENT ON DAY OF DISCHARGE: more than 35 minutes.   Saundra Shelling M.D on 02/05/2019 at 1:48 PM  Between 7am to 6pm - Pager - [phone]  After 6pm go to [email or web address] - password [email or web address] [facility]  Office  [phone]  CC: Primary Rodgers physician; [place] Primary Rodgers  Note: This dictation was prepared with Dragon dictation along with smaller phrase technology. Any transcriptional errors that result from this process are unintentional.

## 2019-02-05 NOTE — TOC Transition Note (Addendum)
Transition of Care [facility]) - CM/SW Discharge Note   Patient Details  Name: Megan Rodgers MRN: [id number] Date of Birth: [birth year]  [facility]) CM/SW Contact:  Sasha Rueth, Lenice Llamas Phone Number: [phone]  02/05/2019, 2:25 PM   Clinical Narrative: PT is recommending home health. Clinical Social Worker (CSW) met with patient and her daughter [name] was at bedside. Patient was alert and oriented X4 and was sitting up in the chair at bedside. CSW introduced self and explained role of CSW department. Per patient she lives in [place] and has [facility] home health already coming out. Per daughter and patient they would like to keep [facility] home health. CSW provided daughter CMS home health list. Per daughter patient has a rolling walker at home and has no DME needs. CSW notified [facility] home health representative Malachy Mood that patient will D/C home today with home health PT and RN. RN aware of above. Please reconsult if future social work needs arise. CSW signing off.     Final next level of care: [place] Barriers to Discharge: Barriers Resolved   Patient Goals and CMS Choice Patient states their goals for this hospitalization and ongoing recovery are:: To go home. CMS Medicare.gov Compare Post Acute Care list provided to:: Patient Represenative (must comment)(Patient's adult daughter [name].) Choice offered to / list presented to : Adult Children  Discharge Placement                  Name of family member notified: Patient's daughter [name] is at bedside and aware of D/C today. Patient and family notified of of transfer: 02/05/19  Discharge Plan and Services                DME Arranged: N/A         HH Arranged: RN, PT [place] Agency: [place] Date [place]: 02/05/19 Time [place]: 1239 Representative spoke with at [place]: [place] ([place]) Interventions      Readmission Risk Interventions No flowsheet data found.

## 2019-02-05 NOTE — Care Management Important Message (Signed)
Important Message  Patient Details  Name: Megan Rodgers MRN: [id number] Date of Birth: [birth year]   Medicare Important Message Given:  Yes     Dannette Barbara 02/05/2019, 11:07 AM

## 2019-02-05 NOTE — Progress Notes (Signed)
Physical Therapy Treatment Patient Details Name: Megan Rodgers MRN: [id number] DOB: 1928-09-05 Today's Date: 02/05/2019    History of Present Illness 84 y.o. female with a past medical history of hypertension, hyponatremia, presents to the emergency department for confusion. Dx: TIA/stroke work up CT: No acute intracranial abnormality    PT Comments    Pt received supine on bed and agreeable to PT session. Mod I with bed mobility from supine to sitting on EOB. SBA with RW transfer to sitting on BSC. SBA with RW ambulated 2x150 feet from room to ortho gym. Vc's provided with body positioning inside of the RW but unable to maintain the posture. Pt didn't show sign or symptom with LOB throughout the session with ambulation RW. SBA with stairs x 4 negotiation with L side railing and R palm support to mimic the right side brick wall to enter the door. Pt states stable posture and adequate strength at the end of the session. No c/o pain. Pt's POC decreased from QD to 2x/wk according to pt's presentation during the session today. Will reassess frequencies depend on the pt's presentation in later session.  Will trial with strength, balance training in the later session to improve functional mobility and ADLs tolerance.     Follow Up Recommendations  Home health PT;Supervision/Assistance - 24 hour     Equipment Recommendations  None recommended by PT (Pt has RW at home)   Recommendations for Other Services       Precautions / Restrictions Precautions Precautions: Fall Restrictions Weight Bearing Restrictions: No    Mobility  Bed Mobility Overal bed mobility: Modified Independent             General bed mobility comments: increase time and effort  Transfers Overall transfer level: Needs assistance Equipment used: Rolling walker (2 wheeled) Transfers: Sit to/from Stand Sit to Stand: Supervision         General transfer comment: vc's provided provided for hand and body  positioning with RW management  Ambulation/Gait Ambulation/Gait assistance: Supervision Gait Distance (Feet): (2x150 feet) Assistive device: Rolling walker (2 wheeled) Gait Pattern/deviations: Step-through pattern Gait velocity: decrease   General Gait Details: Heavy cueing to maintain body positioning inside of the RW; unable to mainting body positioning inside of the RW but no sign or symptom of LOB during ambualtion.   Stairs             Wheelchair Mobility    Modified Rankin (Stroke Patients Only)       Balance Overall balance assessment: Needs assistance Sitting-balance support: No upper extremity supported;Feet supported Sitting balance-Leahy Scale: Good Sitting balance - Comments: steady sitting and reaching within BOS   Standing balance support: Single extremity supported Standing balance-Leahy Scale: Fair Standing balance comment: requires at least one UE support on RW to maintian balance.                            Cognition Arousal/Alertness: Awake/alert Behavior During Therapy: WFL for tasks assessed/performed Overall Cognitive Status: Within Functional Limits for tasks assessed                                        Exercises      General Comments        Pertinent Vitals/Pain Pain Assessment: No/denies pain    Home Living  Prior Function            PT Goals (current goals can now be found in the care plan section) Acute Rehab PT Goals Patient Stated Goal: to go home PT Goal Formulation: With patient Time For Goal Achievement: 02/18/19 Potential to Achieve Goals: Good Progress towards PT goals: Progressing toward goals    Frequency    Min 2X/week      PT Plan Frequency needs to be updated    Co-evaluation              AM-PAC PT "6 Clicks" Mobility   Outcome Measure  Help needed turning from your back to your side while in a flat bed without using bedrails?:  None Help needed moving from lying on your back to sitting on the side of a flat bed without using bedrails?: None Help needed moving to and from a bed to a chair (including a wheelchair)?: A Little Help needed standing up from a chair using your arms (e.g., wheelchair or bedside chair)?: A Little Help needed to walk in hospital room?: A Little Help needed climbing 3-5 steps with a railing? : A Little 6 Click Score: 20    End of Session Equipment Utilized During Treatment: Gait belt Activity Tolerance: Patient tolerated treatment well Patient left: in chair;with call bell/phone within reach;with chair alarm set;with family/visitor present Nurse Communication: Mobility status;Precautions;Weight bearing status PT Visit Diagnosis: Unsteadiness on feet (R26.81);Other abnormalities of gait and mobility (R26.89);Repeated falls (R29.6);History of falling (Z91.81);Muscle weakness (generalized) (M62.81);Difficulty in walking, not elsewhere classified (R26.2)     Time: 7494-4967 PT Time Calculation (min) (ACUTE ONLY): 42 min  Charges:                          Sherrilyn Rist, SPT 02/05/2019, 11:51 AM

## 2019-02-05 NOTE — Progress Notes (Signed)
**Note Megan-Identified via Obfuscation** SLP Cancellation Note  Patient Details Name: Megan Rodgers MRN: [id number] DOB: 1928/08/06   Cancelled treatment:       Reason Eval/Treat Not Completed: SLP screened, no needs identified, will sign off SLP spoke with patient and her daughter.  Her daughter states she is at her cognitive/communication baseline.  She states that the multiple episodes of confusion/aphasia have occurred after the patient forgot a dose of a particular medication.  Per neurology notes: "Patient lives with her son. Able to complete ADLs on her own. Children handles her finances. Daughter handles meds. No driving. No cooking. Someone is with her almost always. No behavior issues."  The patient does not require ST services at this time.  The patient's daughter was given a brochure  for  [employer].  Leroy Sea, MS/CCC- SLP  Lou Miner 02/05/2019, 11:31 AM

## 2020-04-14 IMAGING — CT CT HEAD WITHOUT CONTRAST
3 series · 15 of 45 positions shown, 18 images · non-contrast
Comparison: September 26, 2018

CLINICAL DATA: Confusion, acute

EXAM:
CT HEAD WITHOUT CONTRAST
TECHNIQUE: Contiguous axial images were obtained from the base of the skull
through the vertex without intravenous contrast.

[Series 2: head wo · axial · 0.39mm/px · z∈[-173,-58]mm · 9 of 28 slices shown, 12 images]
[im 3/28  brain]
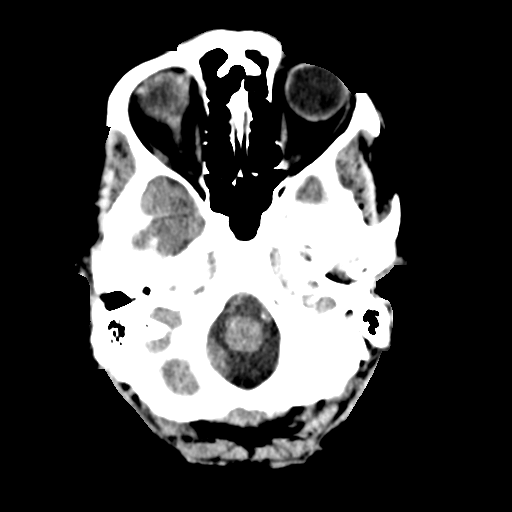
[im 3/28  bone]
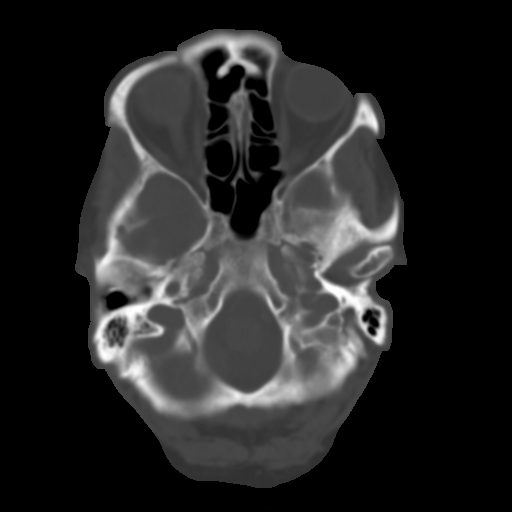
[im 6/28  brain]
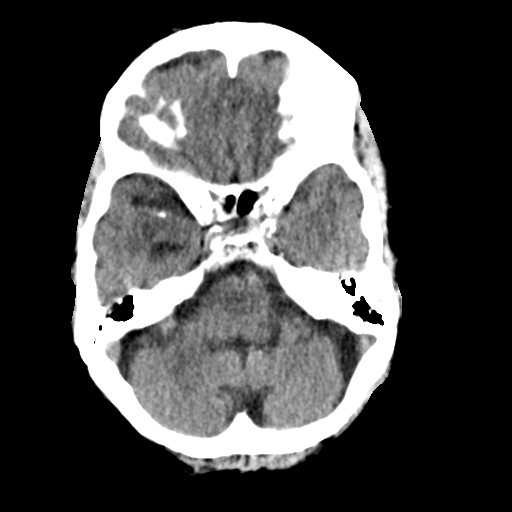
[im 9/28  brain]
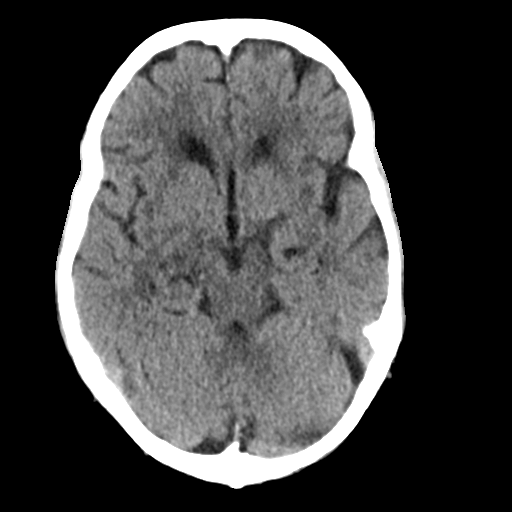
[im 12/28  brain]
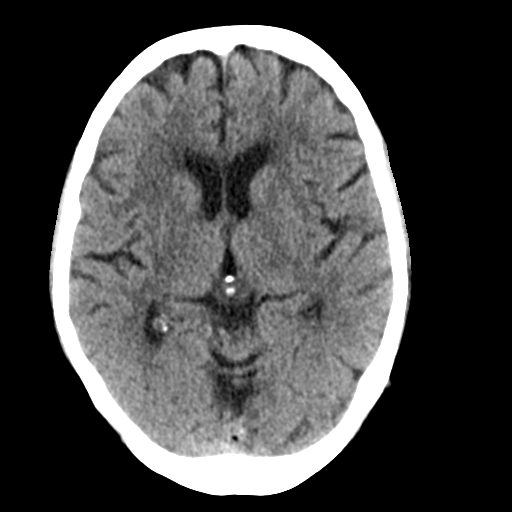
[im 15/28  brain]
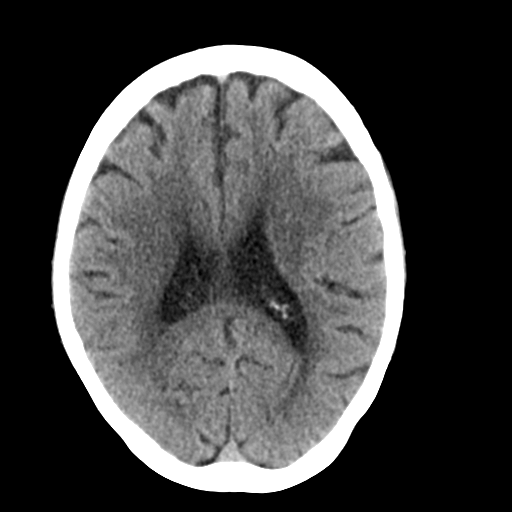
[im 15/28  bone]
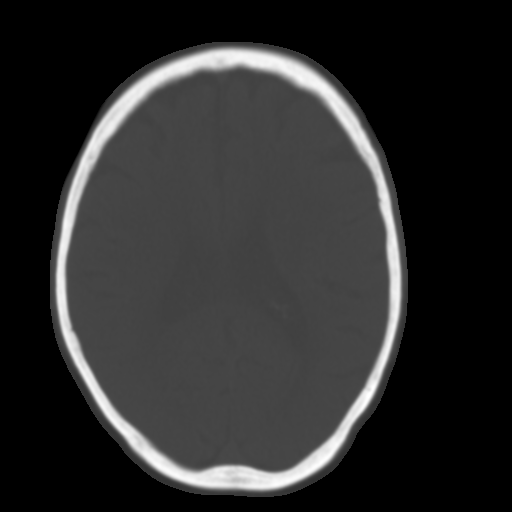
[im 17/28  brain]
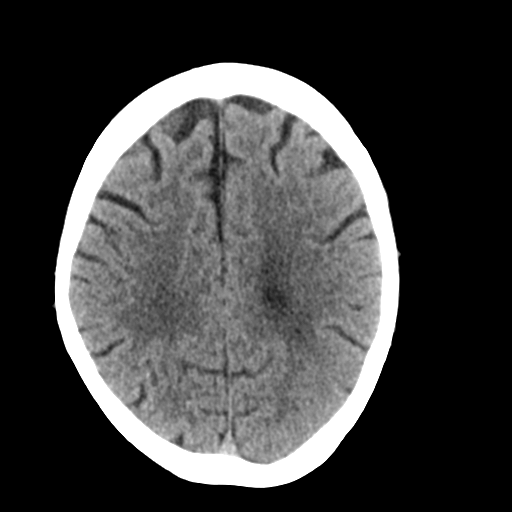
[im 20/28  brain]
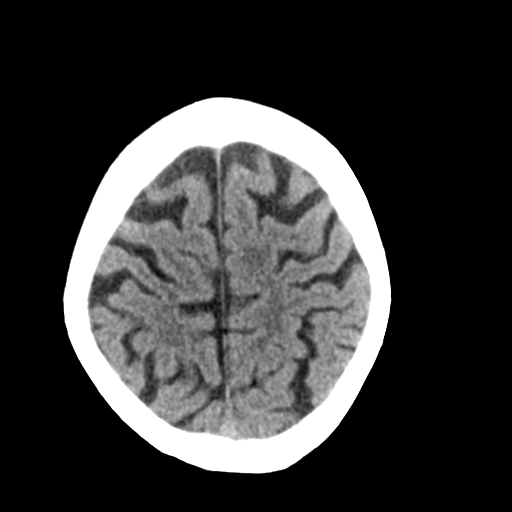
[im 23/28  brain]
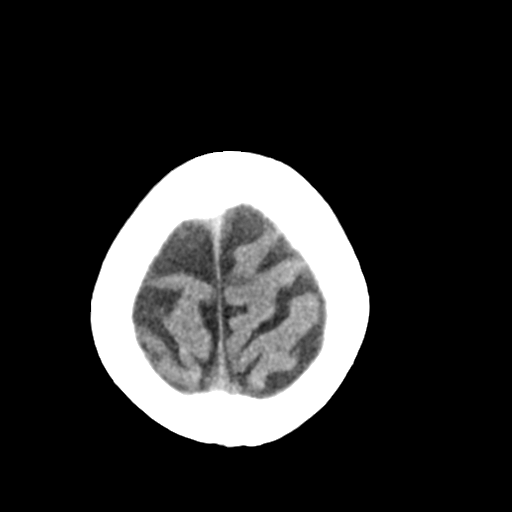
[im 26/28  brain]
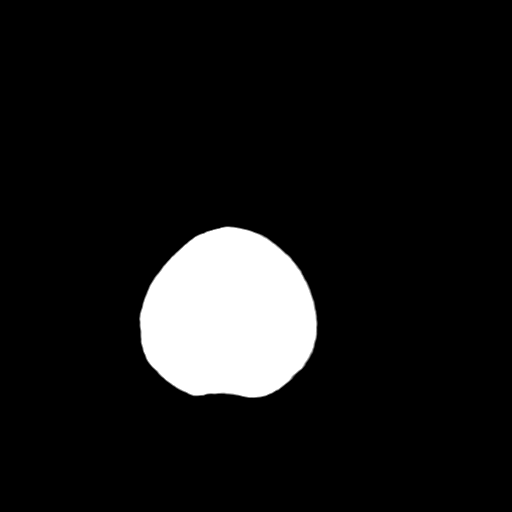
[im 26/28  bone]
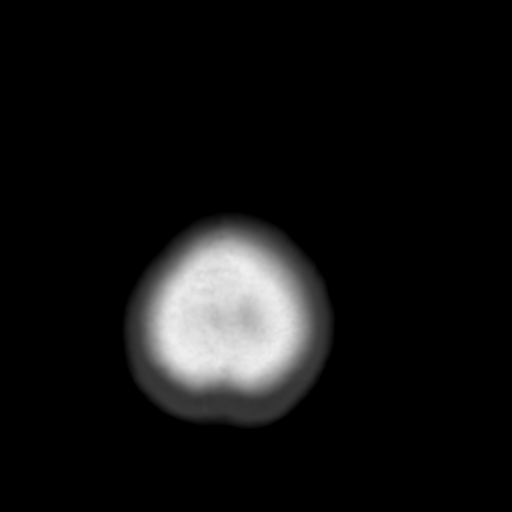

[Series 4: coronal soft tissue · coronal · 0.29mm/px · 3 of 64 slices shown]
[im 22/64  brain]
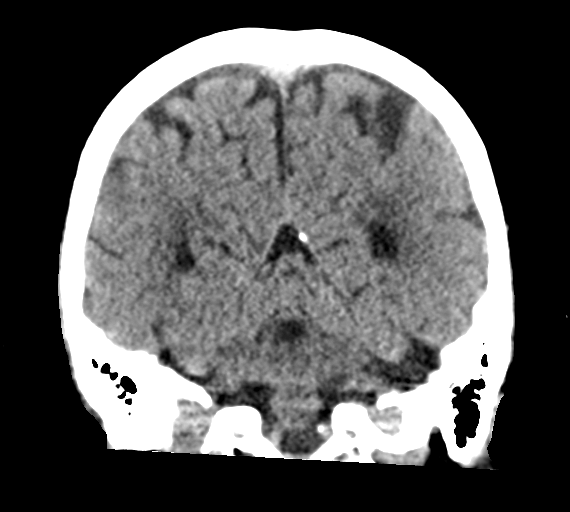
[im 29/64  brain]
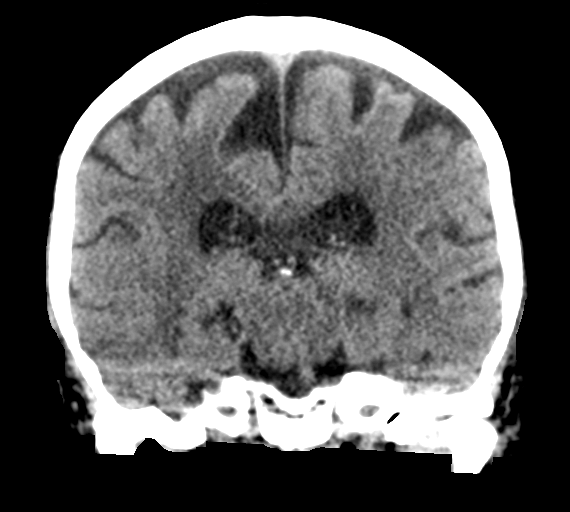
[im 36/64  brain]
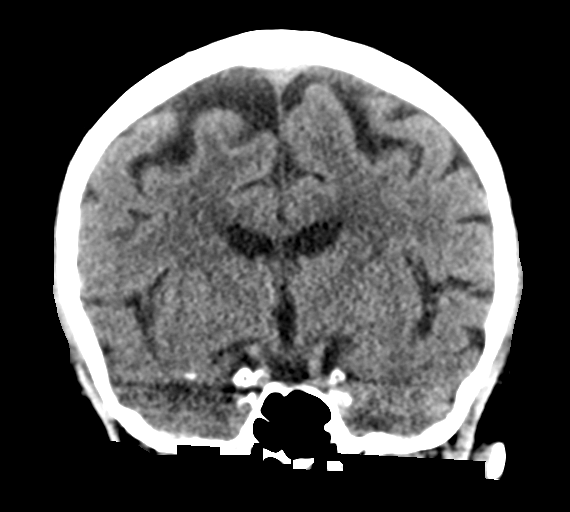

[Series 5: sagittal soft tissue · sagittal · 0.29mm/px · 3 of 56 slices shown]
[im 19/56  brain]
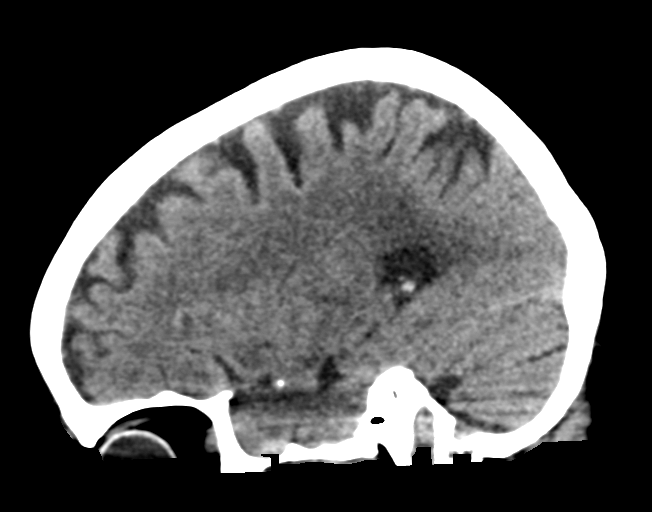
[im 28/56  brain]
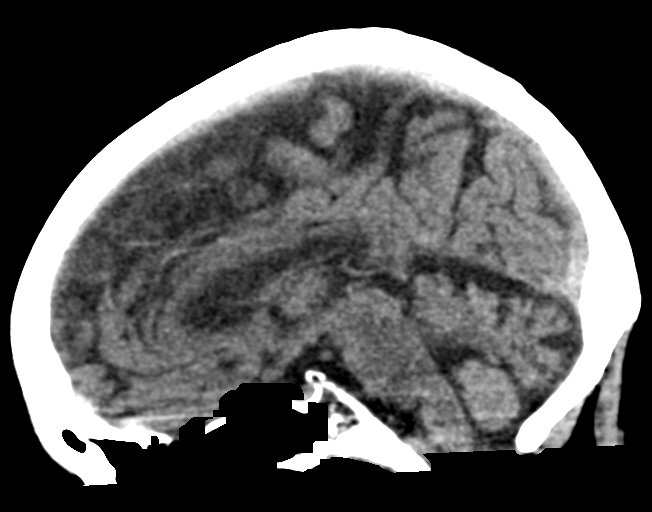
[im 37/56  brain]
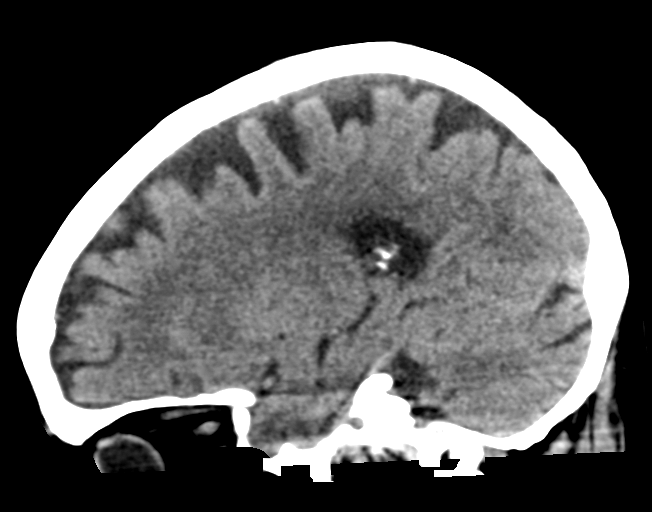

[15 of 45 positions shown; findings below may reference images not displayed]

FINDINGS: Brain: No evidence of acute territorial infarction, hemorrhage,
hydrocephalus,extra-axial collection or mass lesion/mass effect.
There is mild dilatation the ventricles and sulci consistent with
age-related atrophy. Low-attenuation changes in the deep white
matter consistent with small vessel ischemia.

Vascular: No hyperdense vessel or unexpected calcification.

Skull: The skull is intact. No fracture or focal lesion identified.

Sinuses/Orbits: The visualized paranasal sinuses and mastoid air
cells are clear. The orbits and globes intact.

Other: None
IMPRESSION: No acute intracranial abnormality.

Findings consistent with mild age related atrophy and chronic small
vessel ischemia

## 2023-09-08 ENCOUNTER — Encounter: Payer: Self-pay | Admitting: Internal Medicine

## 2023-09-08 ENCOUNTER — Emergency Department

## 2023-09-08 ENCOUNTER — Other Ambulatory Visit: Payer: Self-pay

## 2023-09-08 ENCOUNTER — Observation Stay
Admission: EM | Admit: 2023-09-08 | Discharge: 2023-09-09 | Disposition: A | Discharge status: Home / Self Care | Attending: Internal Medicine | Admitting: Internal Medicine

## 2023-09-08 DIAGNOSIS — R2243 Localized swelling, mass and lump, lower limb, bilateral: Secondary | ICD-10-CM | POA: Insufficient documentation

## 2023-09-08 DIAGNOSIS — F0390 Unspecified dementia without behavioral disturbance: Secondary | ICD-10-CM | POA: Diagnosis not present

## 2023-09-08 DIAGNOSIS — E43 Unspecified severe protein-calorie malnutrition: Secondary | ICD-10-CM | POA: Diagnosis present

## 2023-09-08 DIAGNOSIS — M79661 Pain in right lower leg: Secondary | ICD-10-CM | POA: Diagnosis present

## 2023-09-08 DIAGNOSIS — I10 Essential (primary) hypertension: Secondary | ICD-10-CM | POA: Diagnosis not present

## 2023-09-08 DIAGNOSIS — I272 Pulmonary hypertension, unspecified: Secondary | ICD-10-CM | POA: Diagnosis not present

## 2023-09-08 DIAGNOSIS — I482 Chronic atrial fibrillation, unspecified: Secondary | ICD-10-CM | POA: Diagnosis not present

## 2023-09-08 DIAGNOSIS — I739 Peripheral vascular disease, unspecified: Secondary | ICD-10-CM | POA: Diagnosis not present

## 2023-09-08 DIAGNOSIS — E039 Hypothyroidism, unspecified: Secondary | ICD-10-CM | POA: Insufficient documentation

## 2023-09-08 DIAGNOSIS — R251 Tremor, unspecified: Secondary | ICD-10-CM | POA: Diagnosis not present

## 2023-09-08 DIAGNOSIS — R600 Localized edema: Secondary | ICD-10-CM | POA: Diagnosis not present

## 2023-09-08 DIAGNOSIS — M79604 Pain in right leg: Secondary | ICD-10-CM

## 2023-09-08 DIAGNOSIS — M79605 Pain in left leg: Secondary | ICD-10-CM

## 2023-09-08 DIAGNOSIS — M7989 Other specified soft tissue disorders: Secondary | ICD-10-CM | POA: Insufficient documentation

## 2023-09-08 DIAGNOSIS — I70221 Atherosclerosis of native arteries of extremities with rest pain, right leg: Principal | ICD-10-CM | POA: Insufficient documentation

## 2023-09-08 DIAGNOSIS — I998 Other disorder of circulatory system: Secondary | ICD-10-CM | POA: Diagnosis not present

## 2023-09-08 DIAGNOSIS — G459 Transient cerebral ischemic attack, unspecified: Secondary | ICD-10-CM | POA: Diagnosis present

## 2023-09-08 LAB — TROPONIN I (HIGH SENSITIVITY)
Troponin I (High Sensitivity): 57 ng/L — ABNORMAL HIGH (ref <18)
Troponin I (High Sensitivity): 42 ng/L — ABNORMAL HIGH (ref <18)

## 2023-09-08 LAB — COMPREHENSIVE METABOLIC PANEL
ALT: 11 U/L (ref 0–44)
AST: 20 U/L (ref 15–41)
Albumin: 3.6 g/dL (ref 3.5–5.0)
Alkaline Phosphatase: 77 U/L (ref 38–126)
Anion gap: 6 (ref 5–15)
BUN: 28 mg/dL — ABNORMAL HIGH (ref 8–23)
CO2: 23 mmol/L (ref 22–32)
Calcium: 9.2 mg/dL (ref 8.9–10.3)
Chloride: 110 mmol/L (ref 98–111)
Creatinine, Ser: 1.06 mg/dL — ABNORMAL HIGH (ref 0.44–1.00)
GFR, Estimated: 49 mL/min — ABNORMAL LOW (ref >60)
Glucose, Bld: 100 mg/dL — ABNORMAL HIGH (ref 70–99)
Potassium: 5 mmol/L (ref 3.5–5.1)
Sodium: 139 mmol/L (ref 135–145)
Total Bilirubin: 0.6 mg/dL (ref 0.0–1.2)
Total Protein: 6.9 g/dL (ref 6.5–8.1)

## 2023-09-08 LAB — CBC WITH DIFFERENTIAL/PLATELET
Abs Immature Granulocytes: 0.02 10*3/uL (ref 0.00–0.07)
Basophils Absolute: 0.1 10*3/uL (ref 0.0–0.1)
Basophils Relative: 1 %
Eosinophils Absolute: 0.2 10*3/uL (ref 0.0–0.5)
Eosinophils Relative: 3 %
HCT: 38.5 % (ref 36.0–46.0)
Hemoglobin: 12.4 g/dL (ref 12.0–15.0)
Immature Granulocytes: 0 %
Lymphocytes Relative: 33 %
Lymphs Abs: 1.9 10*3/uL (ref 0.7–4.0)
MCH: 31.6 pg (ref 26.0–34.0)
MCHC: 32.2 g/dL (ref 30.0–36.0)
MCV: 98 fL (ref 80.0–100.0)
Monocytes Absolute: 0.5 10*3/uL (ref 0.1–1.0)
Monocytes Relative: 9 %
Neutro Abs: 3 10*3/uL (ref 1.7–7.7)
Neutrophils Relative %: 54 %
Platelets: 189 10*3/uL (ref 150–400)
RBC: 3.93 MIL/uL (ref 3.87–5.11)
RDW: 15.7 % — ABNORMAL HIGH (ref 11.5–15.5)
WBC: 5.6 10*3/uL (ref 4.0–10.5)
nRBC: 0 % (ref 0.0–0.2)

## 2023-09-08 LAB — PROTIME-INR
INR: 1.2 (ref 0.8–1.2)
Prothrombin Time: 15.1 s (ref 11.4–15.2)

## 2023-09-08 LAB — LACTIC ACID, PLASMA
Lactic Acid, Venous: 1.6 mmol/L (ref 0.5–1.9)
Lactic Acid, Venous: 1.9 mmol/L (ref 0.5–1.9)

## 2023-09-08 LAB — APTT: aPTT: 31 s (ref 24–36)

## 2023-09-08 LAB — BRAIN NATRIURETIC PEPTIDE: B Natriuretic Peptide: 558.2 pg/mL — ABNORMAL HIGH (ref 0.0–100.0)

## 2023-09-08 MED ORDER — PRIMIDONE 50 MG PO TABS
50.0000 mg | ORAL_TABLET | Freq: Four times a day (QID) | ORAL | Status: DC
  Administered 2023-09-08 – 2023-09-09 (×3): 50 mg via ORAL
  Filled 2023-09-08 (×5): qty 1

## 2023-09-08 MED ORDER — METHYLPREDNISOLONE SODIUM SUCC 40 MG IJ SOLR
40.0000 mg | Freq: Once | INTRAMUSCULAR | Status: AC
  Administered 2023-09-08: 40 mg via INTRAVENOUS
  Filled 2023-09-08: qty 1

## 2023-09-08 MED ORDER — PROPRANOLOL HCL 40 MG PO TABS
60.0000 mg | ORAL_TABLET | Freq: Every day | ORAL | Status: DC
  Administered 2023-09-08 – 2023-09-09 (×2): 60 mg via ORAL
  Filled 2023-09-08 (×2): qty 1

## 2023-09-08 MED ORDER — DIPHENHYDRAMINE HCL 50 MG/ML IJ SOLN
50.0000 mg | Freq: Once | INTRAMUSCULAR | Status: AC
  Administered 2023-09-08: 50 mg via INTRAVENOUS
  Filled 2023-09-08: qty 1

## 2023-09-08 MED ORDER — HYDRALAZINE HCL 20 MG/ML IJ SOLN: 5.0000 mg | Freq: Four times a day (QID) | INTRAMUSCULAR | Status: DC | PRN

## 2023-09-08 MED ORDER — HEPARIN (PORCINE) 25000 UT/250ML-% IV SOLN
750.0000 [IU]/h | INTRAVENOUS | Status: DC
  Administered 2023-09-08: 750 [IU]/h via INTRAVENOUS
  Filled 2023-09-08: qty 250

## 2023-09-08 MED ORDER — DONEPEZIL HCL 5 MG PO TABS
5.0000 mg | ORAL_TABLET | Freq: Every day | ORAL | Status: DC
  Administered 2023-09-08: 5 mg via ORAL
  Filled 2023-09-08 (×2): qty 1

## 2023-09-08 MED ORDER — LEVOTHYROXINE SODIUM 50 MCG PO TABS
75.0000 ug | ORAL_TABLET | Freq: Every day | ORAL | Status: DC
  Administered 2023-09-09: 75 ug via ORAL
  Filled 2023-09-08: qty 2

## 2023-09-08 MED ORDER — ONDANSETRON HCL 4 MG/2ML IJ SOLN: 4.0000 mg | Freq: Four times a day (QID) | INTRAMUSCULAR | Status: DC | PRN

## 2023-09-08 MED ORDER — ACETAMINOPHEN 325 MG PO TABS: 650.0000 mg | ORAL_TABLET | Freq: Four times a day (QID) | ORAL | Status: DC | PRN

## 2023-09-08 MED ORDER — DIPHENHYDRAMINE HCL 25 MG PO CAPS: 50.0000 mg | ORAL_CAPSULE | Freq: Once | ORAL | Status: AC

## 2023-09-08 MED ORDER — IOHEXOL 350 MG/ML SOLN
100.0000 mL | Freq: Once | INTRAVENOUS | Status: AC | PRN
  Administered 2023-09-08: 100 mL via INTRAVENOUS

## 2023-09-08 MED ORDER — HEPARIN BOLUS VIA INFUSION
3300.0000 [IU] | Freq: Once | INTRAVENOUS | Status: AC
  Administered 2023-09-08: 3300 [IU] via INTRAVENOUS
  Filled 2023-09-08: qty 3300

## 2023-09-08 MED ORDER — MORPHINE SULFATE (PF) 2 MG/ML IV SOLN: 2.0000 mg | INTRAVENOUS | Status: AC | PRN

## 2023-09-08 MED ORDER — FUROSEMIDE 40 MG PO TABS
20.0000 mg | ORAL_TABLET | Freq: Two times a day (BID) | ORAL | Status: DC
  Administered 2023-09-09: 20 mg via ORAL
  Filled 2023-09-08: qty 1

## 2023-09-08 MED ORDER — DICLOFENAC SODIUM 1 % EX GEL: 2.0000 g | Freq: Four times a day (QID) | CUTANEOUS | Status: DC | PRN

## 2023-09-08 MED ORDER — CITALOPRAM HYDROBROMIDE 10 MG PO TABS
5.0000 mg | ORAL_TABLET | Freq: Every day | ORAL | Status: DC
  Administered 2023-09-08: 5 mg via ORAL
  Filled 2023-09-08 (×2): qty 1

## 2023-09-08 MED ORDER — ACETAMINOPHEN 650 MG RE SUPP: 650.0000 mg | Freq: Four times a day (QID) | RECTAL | Status: DC | PRN

## 2023-09-08 MED ORDER — NAPHAZOLINE-GLYCERIN 0.012-0.2 % OP SOLN
1.0000 [drp] | Freq: Four times a day (QID) | OPHTHALMIC | Status: DC | PRN
Start: 2023-09-08 — End: 2023-09-09
  Filled 2023-09-08: qty 0.1

## 2023-09-08 MED ORDER — VITAMIN D3 25 MCG (1000 UNIT) PO TABS
25.0000 ug | ORAL_TABLET | Freq: Every day | ORAL | Status: DC
  Administered 2023-09-09: 25 ug via ORAL
  Filled 2023-09-08: qty 1

## 2023-09-08 MED ORDER — ASPIRIN 325 MG PO TBEC
325.0000 mg | DELAYED_RELEASE_TABLET | Freq: Every day | ORAL | Status: DC
  Administered 2023-09-09: 325 mg via ORAL
  Filled 2023-09-08: qty 1

## 2023-09-08 MED ORDER — MORPHINE SULFATE (PF) 4 MG/ML IV SOLN: 4.0000 mg | INTRAVENOUS | Status: AC | PRN

## 2023-09-08 MED ORDER — ADULT MULTIVITAMIN W/MINERALS CH
1.0000 | ORAL_TABLET | Freq: Every day | ORAL | Status: DC
  Administered 2023-09-09: 1 via ORAL
  Filled 2023-09-08: qty 1

## 2023-09-08 MED ORDER — MORPHINE SULFATE (PF) 2 MG/ML IV SOLN
2.0000 mg | Freq: Once | INTRAVENOUS | Status: AC
  Administered 2023-09-08: 2 mg via INTRAVENOUS
  Filled 2023-09-08: qty 1

## 2023-09-08 MED ORDER — SENNOSIDES-DOCUSATE SODIUM 8.6-50 MG PO TABS: 1.0000 | ORAL_TABLET | Freq: Every evening | ORAL | Status: DC | PRN

## 2023-09-08 MED ORDER — ONDANSETRON HCL 4 MG PO TABS
4.0000 mg | ORAL_TABLET | Freq: Four times a day (QID) | ORAL | Status: DC | PRN
Start: 2023-09-08 — End: 2023-09-13

## 2023-09-08 NOTE — Hospital Course (Signed)
 Ms. Nychelle Cassata is a 89 year old female with history of atrial fibrillation, hypertension, hypothyroid, pulmonary hypertension, who presents emergency department for chief concerns of right lower extremity pain and discoloration.  Vitals in the ED showed temperature of 97.7, respiration rate of 16, heart rate 77, blood pressure 125/81, SpO2 100% on room air.  Serum sodium is 139, potassium 5.0, chloride 110, bicarb 23, BUN of 28, serum creatinine 1.06, EGFR 49, nonfasting blood glucose 100, WBC 5.6, hemoglobin 12.4, platelets of 159.  BNP was elevated to 558.2.  High since he troponin was 57 and on repeat was 42.  ED treatment: Benadryl 50 mg IV one-time dose, Solu-Medrol 40 mg IV one-time dose, morphine 2 mg IV, heparin bolus and gtt.  EDP consulted vascular service, who recommends starting heparin with admission to hospitalist service.  Vascular service will consult.

## 2023-09-08 NOTE — H&P (Addendum)
 History and Physical   Megan Rodgers ZOX:[id number] DOB: 12/03/1928 DOA: 09/08/2023  PCP: [facility]  Patient coming from: Home via EMS  I have personally briefly reviewed patient's old medical records in [employer] EMR.  Chief Concern: Right lower extremity pain with skin discoloration  HPI: Megan Rodgers is a 89 year old female with history of atrial fibrillation, hypertension, hypothyroid, pulmonary hypertension, who presents emergency department for chief concerns of right lower extremity pain and discoloration.  Vitals in the ED showed temperature of 97.7, respiration rate of 16, heart rate 77, blood pressure 125/81, SpO2 100% on room air.  Serum sodium is 139, potassium 5.0, chloride 110, bicarb 23, BUN of 28, serum creatinine 1.06, EGFR 49, nonfasting blood glucose 100, WBC 5.6, hemoglobin 12.4, platelets of 159.  BNP was elevated to 558.2.  High since he troponin was 57 and on repeat was 42.  ED treatment: Benadryl 50 mg IV one-time dose, Solu-Medrol 40 mg IV one-time dose, morphine 2 mg IV, heparin bolus and gtt.  EDP consulted vascular service, who recommends starting heparin with admission to hospitalist service.  Vascular service will consult. --------------------------------- At bedside, patient is able to tell me her first and last name, age is 89, then 89, 64, location of [place], [place], and current year is 2025.  She does not know why she is in the hospital. She denies being in pain.  Social history: She lives with her son and has inpatient hospice. She is retired.  WJX:BJYNWG to complete due to moderate/severe dementia.  ED Course: Discussed with the EDP, patient requiring hospitalization for chief concerns of acute limb ischemia of the right lower extremity  Assessment/Plan  Principal Problem:   Acute lower limb ischemia Active Problems:   Essential hypertension   Protein-calorie malnutrition, severe   TIA (transient ischemic  attack)   Hypothyroidism   Pulmonary hypertension (HCC)   Atrial fibrillation, chronic (HCC)   Dementia without behavioral disturbance (HCC)   Tremors of nervous system   Leg swelling   Assessment and Plan:  * Acute lower limb ischemia  Continue heparin per pharmacy Vascular service has been consulted by EDP, appreciate further recommendation and guidance  Symptomatic support: Morphine 2 mg IV every 4 hours as needed for moderate pain, 1 day ordered; morphine 4 mg IV every 4 hours as needed for severe pain, 1 day ordered AM team to reevaluate patient at bedside for continued IV pain medication requirements Admit to telemetry cardiac  Leg swelling Patient was placed on furosemide 20 mg p.o. twice daily, resumed on admission for 09/09/2023  Tremors of nervous system Home primidone 50 mg QID resumed  Dementia without behavioral disturbance (HCC) Home donepezil 5 mg nightly resumed  Hypothyroidism Levothyroxine 75 mcg daily resumed on admission  Essential hypertension Home propranolol 60 mg daily resumed Furosemide 20 mg p.o. twice daily resumed on admission Hydralazine 5 mg IV every 6 hours PRN SBP > 165, 5 days ordered  Chart reviewed.   DVT prophylaxis: Heparin GTT Code Status: DNR/DNI, confirmed with [facility]. They have a document. Diet: Heart healthy Family Communication: Update healthcare power of attorney, Megan Rodgers over the phone Disposition Plan: Pending course Consults called: Vascular service Admission status: Telemetry cardiac, inpatient  Past Medical History:  Diagnosis Date   Atrial fibrillation (HCC)    Cancer (HCC)    skin ca   Hypertension    Thyroid disease    Tremors of nervous system    Past Surgical History:  Procedure Laterality Date  ABDOMINAL HYSTERECTOMY     BREAST CYST ASPIRATION Left    lt fna-neg   CARDIAC ELECTROPHYSIOLOGY STUDY AND ABLATION     PACEMAKER INSERTION     TONSILLECTOMY     Social History:  reports that she has  never smoked. She has never used smokeless tobacco. She reports that she does not drink alcohol and does not use drugs.  Allergies  Allergen Reactions   Penicillins Anaphylaxis    Has patient had a PCN reaction causing immediate rash, facial/tongue/throat swelling, SOB or lightheadedness with hypotension: Yes Has patient had a PCN reaction causing severe rash involving mucus membranes or skin necrosis: No Has patient had a PCN reaction that required hospitalization: No Has patient had a PCN reaction occurring within the last 10 years: Unknown If all of the above answers are "NO", then may proceed with Cephalosporin use.    Codeine    Iodinated Contrast Media    Metronidazole    Shellfish-Derived Products    Tetracyclines & Related    Family History  Problem Relation Age of Onset   Hypertension Mother    Breast cancer Neg Hx    Family history: Family history reviewed and not pertinent.  Prior to Admission medications   Medication Sig Start Date End Date Taking? Authorizing Provider  Cholecalciferol (VITAMIN D-3) 1000 units CAPS Take 1 capsule (1,000 Units total) by mouth daily. 04/10/18   Milagros Loll, MD  citalopram (CELEXA) 10 MG tablet Take 5 mg by mouth daily. 06/29/18   [provider]  dabigatran (PRADAXA) 150 MG CAPS capsule Take 150 mg by mouth 2 (two) times daily.    [provider]  donepezil (ARICEPT) 5 MG tablet Take 5 mg by mouth at bedtime. 01/11/19   [provider]  estradiol (ESTRACE) 0.1 MG/GM vaginal cream Place 1 Applicatorful vaginally at bedtime.    [provider]  fluticasone (FLONASE) 50 MCG/ACT nasal spray Place into both nostrils daily.    [provider]  LACTOBACILLUS PO Take by mouth.    [provider]  levothyroxine (SYNTHROID) 75 MCG tablet Take 75 mcg by mouth daily. Take on an empty stomach with a glass of water at least 30-60 minutes before breakfast 09/28/18 09/28/19  [provider]   Multiple Vitamin (MULTIVITAMIN) capsule Take 1 capsule by mouth daily.    [provider]  naphazoline-glycerin (CLEAR EYES REDNESS) 0.012-0.2 % SOLN Apply to eye.    [provider]  primidone (MYSOLINE) 50 MG tablet Take 50 mg by mouth 4 (four) times daily. 06/29/18 06/29/19  [provider]  propranolol (INDERAL) 60 MG tablet Take 60 mg by mouth daily.    [provider]  triamcinolone (KENALOG) 0.025 % cream Apply 1 application topically 2 (two) times daily. 01/28/19   [provider]   Physical Exam: Vitals:   09/08/23 1525 09/08/23 1630 09/08/23 1700 09/08/23 1753  BP:  (!) 162/73 (!) 164/74 (!) 165/75  Pulse:  75  75  Resp:  19 13 17   Temp: 97.6 F (36.4 C)   (!) 97.5 F (36.4 C)  TempSrc: Oral   Oral  SpO2:  91%  99%  Weight:      Height:       Constitutional: appears age-appropriate, frail, NAD, calm Eyes: PERRL, lids and conjunctivae normal ENMT: Mucous membranes are moist. Posterior pharynx clear of any exudate or lesions. Age-appropriate dentition. Hearing appropriate Neck: normal, supple, no masses, no thyromegaly Respiratory: clear to auscultation bilaterally, no wheezing, no crackles.  Normal respiratory effort. No accessory muscle use.  Cardiovascular: Regular rate and rhythm, no murmurs / rubs / gallops. No extremity edema. 2+ pedal pulses. No carotid bruits.  Abdomen: no tenderness, no masses palpated, no hepatosplenomegaly. Bowel sounds positive.  Musculoskeletal: no clubbing / cyanosis. No joint deformity upper and lower extremities. Good ROM, no contractures, no atrophy. Normal muscle tone.  Skin: no rashes, lesions, ulcers. No induration Neurologic: Sensation intact. Strength 5/5 in all 4.  Psychiatric: Normal judgment and insight. Alert and oriented x 3. Normal mood.   EKG: independently reviewed, showing V paced rhythm with rate of 75, QTc 515  Chest x-ray on Admission: Not indicated at this time  CT Angio  Aortobifemoral W and/or Wo Contrast Result Date: 09/08/2023 CLINICAL DATA:  Atraumatic right lower extremity pain since this morning, purple discoloration right foot, decreased pulses EXAM: CT ANGIOGRAPHY OF ABDOMINAL AORTA WITH ILIOFEMORAL RUNOFF TECHNIQUE: Multidetector CT imaging of the abdomen, pelvis and lower extremities was performed using the standard protocol during bolus administration of intravenous contrast. Multiplanar CT image reconstructions and MIPs were obtained to evaluate the vascular anatomy. RADIATION DOSE REDUCTION: This exam was performed according to the departmental dose-optimization program which includes automated exposure control, adjustment of the mA and/or kV according to patient size and/or use of iterative reconstruction technique. CONTRAST:  OMNIPAQUE IOHEXOL 350 MG/ML SOLN COMPARISON:  None Available. FINDINGS: VASCULAR Aorta: Normal caliber aorta without aneurysm, dissection, vasculitis or significant stenosis. Extensive atherosclerosis. Celiac: Patent without evidence of aneurysm, dissection, vasculitis or significant stenosis. SMA: Patent without evidence of aneurysm, dissection, vasculitis or significant stenosis. Renals: There is severe atherosclerosis of the bilateral renal arteries, with high-grade ostial stenosis bilaterally, right greater than left, estimated 70-99%. No aneurysm, dissection, vasculitis, or fibromuscular dysplasia. IMA: Patent without evidence of aneurysm, dissection, vasculitis or significant stenosis. RIGHT Lower Extremity Inflow: Common, internal and external iliac arteries are patent without evidence of aneurysm, dissection, vasculitis or significant stenosis. Extensive atherosclerosis. Outflow: The common femoral artery is widely patent. The profundus femoral and superficial femoral arteries are patent through the level of the mid thigh, with mild diffuse atheromatous plaque without focal stenosis. Beyond the level of the mid thigh, the SFA is  not well visualized due to timing of contrast bolus. Delayed images do not include the unopacified portion of the distal right SFA, and stenosis in this region cannot be excluded. Delayed images beginning at the level the popliteal artery demonstrate wide patency without evidence of critical stenosis. Runoff: The anterior tibial artery and tibioperoneal trunk are widely patent. The anterior tibial artery and a diminutive peroneal artery are visualized to the level of the distal right lower leg. The posterior tibial artery does not opacify. LEFT Lower Extremity Inflow: The common iliac and external iliac arteries are patent. There is extensive atherosclerosis without focal stenosis. There is occlusion of the left internal iliac artery from its origin. No aneurysm, dissection, or vasculitis. Outflow: The left common femoral artery is widely patent. The profundus femoral and superficial femoral arteries are patent to the level of the midthigh. Beyond the mid thigh, the SFA is not well visualized due to timing of contrast bolus. Delayed images do not include the distal SFA and stenosis in this region cannot be excluded. Delayed images beginning at the level of popliteal artery demonstrate patency of the vessel without critical stenosis. Runoff: Anterior tibial artery is patent at its origin. The tibioperoneal trunk is patent at its origin, but the posterior tibial and peroneal arteries do not opacify.  The anterior tibial artery is diminutive, as opacified to the level of the proximal left lower leg. Veins: No obvious venous abnormality within the limitations of this arterial phase study. Review of the MIP images confirms the above findings. NON-VASCULAR Lower chest: The heart is enlarged, with marked biatrial dilatation right greater than left. Dual lead cardiac pacer is identified. Pericardial cyst versus localized pericardial effusion is seen along the right heart border, measuring up to 3.5 cm. Reflux of contrast  into the hepatic veins and IVC suggest significant underlying cardiac dysfunction. Small right pleural effusion with minimal right lower lobe atelectasis. Hepatobiliary: Punctate calcified gallstones without cholecystitis. Liver is unremarkable. Pancreas: Unremarkable. No pancreatic ductal dilatation or surrounding inflammatory changes. Spleen: Normal in size without focal abnormality. Adrenals/Urinary Tract: There is heterogeneous decreased enhancement of the right renal parenchyma relative to the left. This is likely due to the high-grade renal artery stenosis described above in phase of contrast enhancement, though underlying pyelonephritis cannot be excluded. Please correlate with urinalysis. No urinary tract calculi or obstructive uropathy. The bladder is unremarkable. No adrenal masses. Stomach/Bowel: No bowel obstruction or ileus. No bowel wall thickening or inflammatory change. Lymphatic: No pathologic adenopathy. Reproductive: Status post hysterectomy. 1.7 cm simple appearing cyst within the right ovary, no specific follow-up recommended. Left ovary is atrophic. Other: No free fluid or free intraperitoneal gas. No abdominal wall hernia. Musculoskeletal: No acute or destructive bony abnormalities. Reconstructed images demonstrate no additional findings. IMPRESSION: VASCULAR 1. Limited CTA runoff due to timing contrast bolus and slice selection. The mid to distal superficial femoral arteries are not adequately evaluated and stenosis in these regions cannot be excluded. 2. Extensive small vessel disease within the trifurcation vessels as above, with none of the trifurcation vessels opacify beyond the level of the distal lower legs. 3. Extensive aortic atherosclerosis, with high-grade stenoses of the bilateral renal arteries right greater than left. 4. 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 NON-VASCULAR 1. Cardiomegaly, with marked biatrial dilatation. Extensive reflux of contrast into the  hepatic veins and IVC consistent with severe cardiac dysfunction. 2. Localized pericardial effusion versus pericardial cyst along the right heart border, measuring up to 3.5 cm in thickness. 3. Small right pleural effusion. 4. Heterogeneous decreased enhancement of the right renal parenchyma relative to the left, felt to be due to the high-grade renal artery stenosis described above. However, underlying pyelonephritis could have a similar appearance and correlation with urinalysis is recommended. 5. Cholelithiasis without cholecystitis. Electronically Signed   By: Sharlet Salina M.D.   On: 09/08/2023 15:31    Labs on Admission: I have personally reviewed following labs  CBC: Recent Labs  Lab 09/08/23 0825  WBC 5.6  NEUTROABS 3.0  HGB 12.4  HCT 38.5  MCV 98.0  PLT 189   Basic Metabolic Panel: Recent Labs  Lab 09/08/23 0825  NA 139  K 5.0  CL 110  CO2 23  GLUCOSE 100*  BUN 28*  CREATININE 1.06*  CALCIUM 9.2   GFR: Estimated Creatinine Clearance: 30.5 mL/min (A) (by C-G formula based on SCr of 1.06 mg/dL (H)).  Liver Function Tests: Recent Labs  Lab 09/08/23 0825  AST 20  ALT 11  ALKPHOS 77  BILITOT 0.6  PROT 6.9  ALBUMIN 3.6   Coagulation Profile: Recent Labs  Lab 09/08/23 0825  INR 1.2   Urine analysis:    Component Value Date/Time   COLORURINE YELLOW (A) 02/03/2019 1557   APPEARANCEUR HAZY (A) 02/03/2019 1557   LABSPEC 1.011  02/03/2019 1557   PHURINE 6.0 02/03/2019 1557   GLUCOSEU NEGATIVE 02/03/2019 1557   HGBUR LARGE (A) 02/03/2019 1557   BILIRUBINUR NEGATIVE 02/03/2019 1557   KETONESUR NEGATIVE 02/03/2019 1557   PROTEINUR NEGATIVE 02/03/2019 1557   NITRITE POSITIVE (A) 02/03/2019 1557   LEUKOCYTESUR SMALL (A) 02/03/2019 1557   This document was prepared using Dragon Voice Recognition software and may include unintentional dictation errors.  Dr. Sedalia Muta [facility]  If 7PM-7AM, please contact overnight-coverage provider If 7AM-7PM, please  contact day attending provider [email or web address]  09/08/2023, 8:22 PM

## 2023-09-08 NOTE — Assessment & Plan Note (Signed)
 Home primidone 50 mg QID resumed

## 2023-09-08 NOTE — Assessment & Plan Note (Signed)
 Home donepezil 5 mg nightly resumed

## 2023-09-08 NOTE — Consult Note (Signed)
 [facility] Vascular Consult Note  MRN : [id number]  Megan Rodgers is a 89 y.o. (August 18, 1928) female who presents with chief complaint of  Chief Complaint  Patient presents with   Leg Pain  .  History of Present Illness: I am asked to see the patient by Dr. Jodie Echevaria for evaluation of arterial insufficiency of the lower extremities.  The patient has been complaining of some pain in her feet and lower legs.  It is hard to tell if this has been going on for a long time or if it is a newer phenomenon.  The patient has dementia and is not a great historian.  She has been on Pradaxa for atrial fibrillation, but it sounds like that had been stopped at some point.  She does not have any open ulcers.  The patient apparently does not ambulate much anymore.  She does have hospice services.  Both legs are affected but the right leg is bothering her more.  She denies any trauma or injury.  She has undergone a CT angiogram which I have independently reviewed.  As is typical for most CT angiograms, evaluation of her tibial disease is somewhat difficult to discern.  She likely has some chronic atherosclerotic disease of her tibials.  The bolus timing was off so it makes opacification in the popliteal somewhat difficult and the distal images had a gap in the popliteal so we cannot exclude disease in this location, but it seems unlikely.  Current Facility-Administered Medications  Medication Dose Route Frequency Provider Last Rate Last Admin   acetaminophen (TYLENOL) tablet 650 mg  650 mg Oral Q6H PRN Cox, Amy N, DO       Or   acetaminophen (TYLENOL) suppository 650 mg  650 mg Rectal Q6H PRN Cox, Amy N, DO       heparin ADULT infusion 100 units/mL (25000 units/283mL)  750 Units/hr Intravenous Continuous Cox, Amy N, DO 7.5 mL/hr at 09/08/23 1523 750 Units/hr at 09/08/23 1523   hydrALAZINE (APRESOLINE) injection 5 mg  5 mg Intravenous Q6H PRN Cox, Amy N, DO       [START ON 09/09/2023]  levothyroxine (SYNTHROID) tablet 75 mcg  75 mcg Oral Q0600 Cox, Amy N, DO       morphine (PF) 2 MG/ML injection 2 mg  2 mg Intravenous Q4H PRN Cox, Amy N, DO       morphine (PF) 4 MG/ML injection 4 mg  4 mg Intravenous Q4H PRN Cox, Amy N, DO       ondansetron (ZOFRAN) tablet 4 mg  4 mg Oral Q6H PRN Cox, Amy N, DO       Or   ondansetron (ZOFRAN) injection 4 mg  4 mg Intravenous Q6H PRN Cox, Amy N, DO       senna-docusate (Senokot-S) tablet 1 tablet  1 tablet Oral QHS PRN Cox, Amy N, DO       Current Outpatient Medications  Medication Sig Dispense Refill   Cholecalciferol (VITAMIN D-3) 1000 units CAPS Take 1 capsule (1,000 Units total) by mouth daily. 60 capsule    citalopram (CELEXA) 10 MG tablet Take 5 mg by mouth daily.     dabigatran (PRADAXA) 150 MG CAPS capsule Take 150 mg by mouth 2 (two) times daily.     donepezil (ARICEPT) 5 MG tablet Take 5 mg by mouth at bedtime.     estradiol (ESTRACE) 0.1 MG/GM vaginal cream Place 1 Applicatorful vaginally at bedtime.     fluticasone (FLONASE) 50  MCG/ACT nasal spray Place into both nostrils daily.     LACTOBACILLUS PO Take by mouth.     levothyroxine (SYNTHROID) 75 MCG tablet Take 75 mcg by mouth daily. Take on an empty stomach with a glass of water at least 30-60 minutes before breakfast     Multiple Vitamin (MULTIVITAMIN) capsule Take 1 capsule by mouth daily.     naphazoline-glycerin (CLEAR EYES REDNESS) 0.012-0.2 % SOLN Apply to eye.     primidone (MYSOLINE) 50 MG tablet Take 50 mg by mouth 4 (four) times daily.     propranolol (INDERAL) 60 MG tablet Take 60 mg by mouth daily.     triamcinolone (KENALOG) 0.025 % cream Apply 1 application topically 2 (two) times daily.      Past Medical History:  Diagnosis Date   Atrial fibrillation (HCC)    Cancer (HCC)    skin ca   Hypertension    Thyroid disease    Tremors of nervous system     Past Surgical History:  Procedure Laterality Date   ABDOMINAL HYSTERECTOMY     BREAST CYST ASPIRATION  Left    lt [id number]   CARDIAC ELECTROPHYSIOLOGY STUDY AND ABLATION     PACEMAKER INSERTION     TONSILLECTOMY       Social History   Tobacco Use   Smoking status: Never   Smokeless tobacco: Never  Substance Use Topics   Alcohol use: No   Drug use: No    Family History  Problem Relation Age of Onset   Hypertension Mother    Breast cancer Neg Hx   No bleeding or clotting disorders  Allergies  Allergen Reactions   Penicillins Anaphylaxis    Has patient had a PCN reaction causing immediate rash, facial/tongue/throat swelling, SOB or lightheadedness with hypotension: Yes Has patient had a PCN reaction causing severe rash involving mucus membranes or skin necrosis: No Has patient had a PCN reaction that required hospitalization: No Has patient had a PCN reaction occurring within the last 10 years: Unknown If all of the above answers are "NO", then may proceed with Cephalosporin use.    Codeine    Iodinated Contrast Media    Metronidazole    Shellfish-Derived Products    Tetracyclines & Related      REVIEW OF SYSTEMS (Negative unless checked)  Constitutional: [] Weight loss  [] Fever  [] Chills Cardiac: [] Chest pain   [] Chest pressure   [x] Palpitations   [] Shortness of breath when laying flat   [] Shortness of breath at rest   [] Shortness of breath with exertion. Vascular:  [] Pain in legs with walking   [] Pain in legs at rest   [] Pain in legs when laying flat   [] Claudication   [] Pain in feet when walking  [] Pain in feet at rest  [] Pain in feet when laying flat   [] History of DVT   [] Phlebitis   [] Swelling in legs   [] Varicose veins   [] Non-healing ulcers Pulmonary:   [] Uses home oxygen   [] Productive cough   [] Hemoptysis   [] Wheeze  [] COPD   [] Asthma Neurologic:  [] Dizziness  [] Blackouts   [] Seizures   [] History of stroke   [] History of TIA  [] Aphasia   [] Temporary blindness   [] Dysphagia   [] Weakness or numbness in arms   [] Weakness or numbness in legs X positive for  dementia Musculoskeletal:  [] Arthritis   [] Joint swelling   [] Joint pain   [] Low back pain Hematologic:  [] Easy bruising  [] Easy bleeding   [] Hypercoagulable state   [] Anemic  []   Hepatitis Gastrointestinal:  [] Blood in stool   [] Vomiting blood  [] Gastroesophageal reflux/heartburn   [] Difficulty swallowing. Genitourinary:  [] Chronic kidney disease   [] Difficult urination  [] Frequent urination  [] Burning with urination   [] Blood in urine Skin:  [] Rashes   [] Ulcers   [] Wounds Psychological:  [] History of anxiety   []  History of major depression.  Physical Examination  Vitals:   09/08/23 0819 09/08/23 1130 09/08/23 1515 09/08/23 1525  BP: 125/81 (!) 142/76 (!) 147/80   Pulse: 77 80 75   Resp: 16 14 12    Temp: 97.7 F (36.5 C)   97.6 F (36.4 C)  TempSrc: Oral   Oral  SpO2: 100% 100% 97%   Weight:      Height:       Body mass index is 22.68 kg/m. Gen:   NAD. Elderly and frail appearing Head: [place]/AT, No temporalis wasting.  Ear/Nose/Throat: Hearing grossly intact, nares w/o erythema or drainage, oropharynx w/o Erythema/Exudate Eyes: Sclera non-icteric, conjunctiva clear Neck: Trachea midline.  No JVD.  Pulmonary:  Good air movement, respirations not labored, equal bilaterally.  Cardiac: irregular Vascular:  Vessel Right Left  Radial Palpable Palpable                          PT Not Palpable Not Palpable  DP Not Palpable Not Palpable   Musculoskeletal: M/S 5/5 throughout.  No deformity or atrophy. No edema.  She is fairly warm down to the foot.  Her capillary refill is somewhat sluggish in both feet.  No open wounds or infection. Neurologic: Sensation grossly intact in extremities.  Symmetrical.  Speech is fluent. Motor exam as listed above. Psychiatric: Judgment intact, Mood & affect appropriate for pt's clinical situation. Dermatologic: No rashes or ulcers noted.  No cellulitis or open wounds.      CBC Lab Results  Component Value Date   WBC 5.6 09/08/2023   HGB 12.4  09/08/2023   HCT 38.5 09/08/2023   MCV 98.0 09/08/2023   PLT 189 09/08/2023    BMET    Component Value Date/Time   NA 139 09/08/2023 0825   K 5.0 09/08/2023 0825   CL 110 09/08/2023 0825   CO2 23 09/08/2023 0825   GLUCOSE 100 (H) 09/08/2023 0825   BUN 28 (H) 09/08/2023 0825   CREATININE 1.06 (H) 09/08/2023 0825   CALCIUM 9.2 09/08/2023 0825   GFRNONAA 49 (L) 09/08/2023 0825   GFRAA >60 02/03/2019 1349   Estimated Creatinine Clearance: 30.5 mL/min (A) (by C-G formula based on SCr of 1.06 mg/dL (H)).  COAG Lab Results  Component Value Date   INR 1.2 09/08/2023   INR 1.2 09/26/2018    Radiology CT Angio Aortobifemoral W and/or Wo Contrast Result Date: 09/08/2023 CLINICAL DATA:  Atraumatic right lower extremity pain since this morning, purple discoloration right foot, decreased pulses EXAM: CT ANGIOGRAPHY OF ABDOMINAL AORTA WITH ILIOFEMORAL RUNOFF TECHNIQUE: Multidetector CT imaging of the abdomen, pelvis and lower extremities was performed using the standard protocol during bolus administration of intravenous contrast. Multiplanar CT image reconstructions and MIPs were obtained to evaluate the vascular anatomy. RADIATION DOSE REDUCTION: This exam was performed according to the departmental dose-optimization program which includes automated exposure control, adjustment of the mA and/or kV according to patient size and/or use of iterative reconstruction technique. CONTRAST:  OMNIPAQUE IOHEXOL 350 MG/ML SOLN COMPARISON:  None Available. FINDINGS: VASCULAR Aorta: Normal caliber aorta without aneurysm, dissection, vasculitis or significant stenosis. Extensive atherosclerosis. Celiac: Patent without  evidence of aneurysm, dissection, vasculitis or significant stenosis. SMA: Patent without evidence of aneurysm, dissection, vasculitis or significant stenosis. Renals: There is severe atherosclerosis of the bilateral renal arteries, with high-grade ostial stenosis bilaterally, right greater  than left, estimated 70-99%. No aneurysm, dissection, vasculitis, or fibromuscular dysplasia. IMA: Patent without evidence of aneurysm, dissection, vasculitis or significant stenosis. RIGHT Lower Extremity Inflow: Common, internal and external iliac arteries are patent without evidence of aneurysm, dissection, vasculitis or significant stenosis. Extensive atherosclerosis. Outflow: The common femoral artery is widely patent. The profundus femoral and superficial femoral arteries are patent through the level of the mid thigh, with mild diffuse atheromatous plaque without focal stenosis. Beyond the level of the mid thigh, the SFA is not well visualized due to timing of contrast bolus. Delayed images do not include the unopacified portion of the distal right SFA, and stenosis in this region cannot be excluded. Delayed images beginning at the level the popliteal artery demonstrate wide patency without evidence of critical stenosis. Runoff: The anterior tibial artery and tibioperoneal trunk are widely patent. The anterior tibial artery and a diminutive peroneal artery are visualized to the level of the distal right lower leg. The posterior tibial artery does not opacify. LEFT Lower Extremity Inflow: The common iliac and external iliac arteries are patent. There is extensive atherosclerosis without focal stenosis. There is occlusion of the left internal iliac artery from its origin. No aneurysm, dissection, or vasculitis. Outflow: The left common femoral artery is widely patent. The profundus femoral and superficial femoral arteries are patent to the level of the midthigh. Beyond the mid thigh, the SFA is not well visualized due to timing of contrast bolus. Delayed images do not include the distal SFA and stenosis in this region cannot be excluded. Delayed images beginning at the level of popliteal artery demonstrate patency of the vessel without critical stenosis. Runoff: Anterior tibial artery is patent at its origin.  The tibioperoneal trunk is patent at its origin, but the posterior tibial and peroneal arteries do not opacify. The anterior tibial artery is diminutive, as opacified to the level of the proximal left lower leg. Veins: No obvious venous abnormality within the limitations of this arterial phase study. Review of the MIP images confirms the above findings. NON-VASCULAR Lower chest: The heart is enlarged, with marked biatrial dilatation right greater than left. Dual lead cardiac pacer is identified. Pericardial cyst versus localized pericardial effusion is seen along the right heart border, measuring up to 3.5 cm. Reflux of contrast into the hepatic veins and IVC suggest significant underlying cardiac dysfunction. Small right pleural effusion with minimal right lower lobe atelectasis. Hepatobiliary: Punctate calcified gallstones without cholecystitis. Liver is unremarkable. Pancreas: Unremarkable. No pancreatic ductal dilatation or surrounding inflammatory changes. Spleen: Normal in size without focal abnormality. Adrenals/Urinary Tract: There is heterogeneous decreased enhancement of the right renal parenchyma relative to the left. This is likely due to the high-grade renal artery stenosis described above in phase of contrast enhancement, though underlying pyelonephritis cannot be excluded. Please correlate with urinalysis. No urinary tract calculi or obstructive uropathy. The bladder is unremarkable. No adrenal masses. Stomach/Bowel: No bowel obstruction or ileus. No bowel wall thickening or inflammatory change. Lymphatic: No pathologic adenopathy. Reproductive: Status post hysterectomy. 1.7 cm simple appearing cyst within the right ovary, no specific follow-up recommended. Left ovary is atrophic. Other: No free fluid or free intraperitoneal gas. No abdominal wall hernia. Musculoskeletal: No acute or destructive bony abnormalities. Reconstructed images demonstrate no additional findings. IMPRESSION: VASCULAR 1.  Limited  CTA runoff due to timing contrast bolus and slice selection. The mid to distal superficial femoral arteries are not adequately evaluated and stenosis in these regions cannot be excluded. 2. Extensive small vessel disease within the trifurcation vessels as above, with none of the trifurcation vessels opacify beyond the level of the distal lower legs. 3. Extensive aortic atherosclerosis, with high-grade stenoses of the bilateral renal arteries right greater than left. 4. 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 NON-VASCULAR 1. Cardiomegaly, with marked biatrial dilatation. Extensive reflux of contrast into the hepatic veins and IVC consistent with severe cardiac dysfunction. 2. Localized pericardial effusion versus pericardial cyst along the right heart border, measuring up to 3.5 cm in thickness. 3. Small right pleural effusion. 4. Heterogeneous decreased enhancement of the right renal parenchyma relative to the left, felt to be due to the high-grade renal artery stenosis described above. However, underlying pyelonephritis could have a similar appearance and correlation with urinalysis is recommended. 5. Cholelithiasis without cholecystitis. Electronically Signed   By: Sharlet Salina M.D.   On: 09/08/2023 15:31      Assessment/Plan 1. Arterial insufficiency of the lower extremities. She has undergone a CT angiogram which I have independently reviewed.  As is typical for most CT angiograms, evaluation of her tibial disease is somewhat difficult to discern.  She likely has some chronic atherosclerotic disease of her tibials.  The bolus timing was off so it makes opacification in the popliteal somewhat difficult and the distal images had a gap in the popliteal so we cannot exclude disease in this location, but it seems unlikely.  Given the clinical appearance and the appearance of the CTA, this is likely chronic tibial and small vessel disease largely and not an acute ischemic situation.   Options for revascularization are present in the form of angiography with possible intervention for tibial disease, but I am not sure how much benefit that is really going to have in her setting.  The extensive reflux of contrast into her hepatic veins and IVC would be indicative of severe cardiac dysfunction, and I think a large portion of her poor distal perfusion is cardiac dysfunction.  I do not think vascular intervention is likely to be of much benefit.  I would anticoagulate her with heparin and see how she does over the next day or 2, but I think vascular surgery intervention is unlikely to be of great benefit. 2.  Dementia.  On hospice services. 3.  Atrial fibrillation.  Would just anticoagulate.  Do not think she has had an embolic phenomenon based off of my review of the CT angiogram. 4.  Hypertension.  Stable on outpatient medications and blood pressure control important in reducing the progression of atherosclerotic disease. On appropriate oral medications.    Festus Barren, MD  09/08/2023 3:49 PM    This note was created with Dragon medical transcription system.  Any error is purely unintentional

## 2023-09-08 NOTE — Assessment & Plan Note (Signed)
 Levothyroxine 75 mcg daily resumed on admission

## 2023-09-08 NOTE — Assessment & Plan Note (Addendum)
 Home propranolol 60 mg daily resumed Furosemide 20 mg p.o. twice daily resumed on admission Hydralazine 5 mg IV every 6 hours PRN SBP > 165, 5 days ordered

## 2023-09-08 NOTE — Assessment & Plan Note (Addendum)
  Continue heparin per pharmacy Vascular service has been consulted by EDP, appreciate further recommendation and guidance  Symptomatic support: Morphine 2 mg IV every 4 hours as needed for moderate pain, 1 day ordered; morphine 4 mg IV every 4 hours as needed for severe pain, 1 day ordered AM team to reevaluate patient at bedside for continued IV pain medication requirements Admit to telemetry cardiac

## 2023-09-08 NOTE — ED Provider Notes (Signed)
 [name] Provider Note    Event Date/Time   First MD Initiated Contact with Patient 09/08/23 0825     (approximate)   History   Leg Pain   HPI  Megan Rodgers is a 89 y.o. female history of atrial fibrillation, hypertension, thyroid dysfunction, presenting with right lower extremity pain.  Patient is a poor historian but states that she has bilateral lower extremity pain has been ongoing for a while as well as pins-and-needles and slight numbness bilaterally.  Per EMS patient complained of worsening pain to her right lower extremity today.  Patient states that pain extends from her foot to her proximal leg.  Denies any trauma or falls.   On independent chart review she is on Pradaxa.  On independent history from daughter who is patient's power of attorney, she been off pradaxa since Oct and on 325mg  aspirin.  Does have history of pulmonary hypertension, is on Lasix.  No prior history of peripheral artery disease.     Physical Exam   Triage Vital Signs: ED Triage Vitals  Encounter Vitals Group     BP 09/08/23 0819 125/81     Systolic BP Percentile --      Diastolic BP Percentile --      Pulse Rate 09/08/23 0819 77     Resp 09/08/23 0819 16     Temp 09/08/23 0819 97.7 F (36.5 C)     Temp Source 09/08/23 0819 Oral     SpO2 09/08/23 0819 100 %     Weight 09/08/23 0817 136 lb 4.8 oz (61.8 kg)     Height 09/08/23 0817 5\' 5"  (1.651 m)     Head Circumference --      Peak Flow --      Pain Score 09/08/23 0816 0     Pain Loc --      Pain Education --      Exclude from Growth Chart --     Most recent vital signs: Vitals:   09/08/23 0819 09/08/23 1130  BP: 125/81 (!) 142/76  Pulse: 77 80  Resp: 16 14  Temp: 97.7 F (36.5 C)   SpO2: 100% 100%     General: Awake, no distress.  CV:  Good peripheral perfusion.  Resp:  Normal effort.  Abd:  No distention.  Soft nontender Other:  No palpable deformities to her bilateral lower extremities,  plantar portions of her toes and distal soles are mottled and slightly discolored, worse on the right.  No palpable DP pulses or PT pulses on the right, able to palpate PT pulse on the left but no DP pulses palpated on the left.  She does have some decreased sensation bilaterally to her lower extremities.  Able to dorsi and plantarflex as well as raise her legs without focal weakness.  She does have some tenderness to her bilateral lower extremities up to her distal calves.  They do not appear symmetric, no obvious edema.  Bedside ultrasound used to assess her DP and PT pulses bilaterally, no Doppler flow noted in both her PT and DP pulses.   ED Results / Procedures / Treatments   Labs (all labs ordered are listed, but only abnormal results are displayed) Labs Reviewed  COMPREHENSIVE METABOLIC PANEL - Abnormal; Notable for the following components:      Result Value   Glucose, Bld 100 (*)    BUN 28 (*)    Creatinine, Ser 1.06 (*)    GFR, Estimated 49 (*)  All other components within normal limits  CBC WITH DIFFERENTIAL/PLATELET - Abnormal; Notable for the following components:   RDW 15.7 (*)    All other components within normal limits  BRAIN NATRIURETIC PEPTIDE - Abnormal; Notable for the following components:   B Natriuretic Peptide 558.2 (*)    All other components within normal limits  TROPONIN I (HIGH SENSITIVITY) - Abnormal; Notable for the following components:   Troponin I (High Sensitivity) 57 (*)    All other components within normal limits  TROPONIN I (HIGH SENSITIVITY) - Abnormal; Notable for the following components:   Troponin I (High Sensitivity) 42 (*)    All other components within normal limits  PROTIME-INR  LACTIC ACID, PLASMA  LACTIC ACID, PLASMA  APTT  HEPARIN LEVEL (UNFRACTIONATED)     EKG  V paced rhythm, reassigning 5, 1 and QRS, QTc, does not meet Sgarbossa's criteria, T wave inversions to 1, aVL.  Not significant compared to prior   RADIOLOGY On my  interpretation of the CT angio, for the arterial phase, did not see flow towards the end of her distal legs down.   PROCEDURES:  Critical Care performed: Yes, see critical care procedure note(s)  .Critical Care  Performed by: Claybon Jabs, MD Authorized by: Claybon Jabs, MD   Critical care provider statement:    Critical care time (minutes):  35   Critical care was necessary to treat or prevent imminent or life-threatening deterioration of the following conditions:  Circulatory failure   Critical care was time spent personally by me on the following activities:  Development of treatment plan with patient or surrogate, discussions with consultants, evaluation of patient's response to treatment, examination of patient, ordering and review of laboratory studies, ordering and review of radiographic studies, ordering and performing treatments and interventions, pulse oximetry, re-evaluation of patient's condition and review of old charts    MEDICATIONS ORDERED IN ED: Medications  heparin bolus via infusion 3,300 Units (has no administration in time range)  heparin ADULT infusion 100 units/mL (25000 units/243mL) (has no administration in time range)  methylPREDNISolone sodium succinate (SOLU-MEDROL) 40 mg/mL injection 40 mg (40 mg Intravenous Given 09/08/23 0854)  diphenhydrAMINE (BENADRYL) capsule 50 mg ( Oral See Alternative 09/08/23 1228)    Or  diphenhydrAMINE (BENADRYL) injection 50 mg (50 mg Intravenous Given 09/08/23 1228)  morphine (PF) 2 MG/ML injection 2 mg (2 mg Intravenous Given 09/08/23 0853)  iohexol (OMNIPAQUE) 350 MG/ML injection 100 mL (100 mLs Intravenous Contrast Given 09/08/23 1320)     IMPRESSION / MDM / ASSESSMENT AND PLAN / ED COURSE  I reviewed the triage vital signs and the nursing notes.                              Differential diagnosis includes, but is not limited to, worsening peripheral artery disease, limb ischemia, electrolyte derangements, musculoskeletal  pain.  Will get labs, will give her IV morphine for pain, will get CT with runoff.  Patient does have an allergy to contrast, she does not know what her reaction is.  Will also contact her power of attorney to discuss goals of care.  Patient's presentation is most consistent with acute presentation with potential threat to life or bodily function.  Independent review of labs and imaging are below.  Vascular surgery was consulted, recommended heparin and admission.  Will plan to have her admitted.  Consult hospitalist was agreeable plan and will evaluate the patient.  Clinical Course as of 09/08/23 1505  Mon Sep 08, 2023  0845 Spoke to patient's daughter Lenward Chancellor since her numbers in the chart and was able to get patient's power of attorney Stanton Kidney Capp's phone number [phone].  Olegario Messier is not aware of any history of peripheral artery disease but does say that her mom's been having difficulty with weakness and walking for a while. [TT]  0915 Called patient's daughter Gavin Pound, reached voicemail, left a message to have her call me back. [TT]  [id number] On reassessment patient states that she does not have pain at this time.  She is nontender to to her lower extremities. [TT]  1020 Spoke to patient power of attorney, she is on hospice but okay for admission and interventions as needed.  States that she like to be called after the CT scan results to discuss what she thinks the patient's goals of care are with regards to possible surgery. [TT]  1414 Independent review of labs, no leukocytosis, electrolytes not severely deranged, she has mild AKI, troponin was initially elevated, downtrending on the second 1, lactate is normal, BNP is mildly elevated.  She has no shortness of breath or chest pain at this time. [TT]  1442 Consulted at vascular surgery who looked at the images, recommended starting her on heparin and admitting her to hospitalist.  They will consult and discussed with daughter about goals of care  and plan.  They do see some runoff distally in the delayed phase for the CT angio. [TT]  1446 Called patient's power of attorney who is agreeable plan for admission and starting her on heparin.  She states that hospice call today to discharge her from hospice. [TT]    Clinical Course User Index [TT] Jodie Echevaria, Franchot Erichsen, MD     FINAL CLINICAL IMPRESSION(S) / ED DIAGNOSES   Final diagnoses:  Peripheral artery disease (HCC)  Pain in both lower extremities     Rx / DC Orders   ED Discharge Orders     None        Note:  This document was prepared using Dragon voice recognition software and may include unintentional dictation errors.    Claybon Jabs, MD 09/08/23 [phone]

## 2023-09-08 NOTE — ED Triage Notes (Signed)
 Pt arrives via [facility] from home for atraumatic RLE pain. Pt reports pain started this morning upon waking. Toes to R foot have purple discoloration. DP pulses not felt in either foot. PT felt in L leg, not in R leg. Per EMS, popliteal pulse palpable in R leg

## 2023-09-08 NOTE — Consult Note (Signed)
 Pharmacy Consult Note - Anticoagulation  Pharmacy Consult for heparin Indication:  PAD, acute ischemia  PATIENT MEASUREMENTS: Height: 5\' 5"  (165.1 cm) Weight: 61.8 kg (136 lb 4.8 oz) IBW/kg (Calculated) : 57 HEPARIN DW (KG): 61.8  VITAL SIGNS: Temp: 97.7 F (36.5 C) (03/17 0819) Temp Source: Oral (03/17 0819) BP: 142/76 (03/17 1130) Pulse Rate: 80 (03/17 1130)  Recent Labs    09/08/23 0825 09/08/23 1030  HGB 12.4  --   HCT 38.5  --   PLT 189  --   APTT 31  --   LABPROT 15.1  --   INR 1.2  --   CREATININE 1.06*  --   TROPONINIHS 57* 42*    Estimated Creatinine Clearance: 30.5 mL/min (A) (by C-G formula based on SCr of 1.06 mg/dL (H)).  PAST MEDICAL HISTORY: Past Medical History:  Diagnosis Date   Atrial fibrillation (HCC)    Cancer (HCC)    skin ca   Hypertension    Thyroid disease    Tremors of nervous system     ASSESSMENT: 89 y.o. female with PMH PAD is presenting with RLE pain / claudication. Toes to R foot have purple discoloration. Patient is not on chronic anticoagulation per chart review. Pharmacy has been consulted to initiate and manage heparin intravenous infusion.  Pertinent medications: No chronic anticoagulation PTA per chart review  Goal(s) of therapy: Heparin level 0.3 - 0.7 units/mL Monitor platelets by anticoagulation protocol: Yes   Baseline anticoagulation labs: Recent Labs    09/08/23 0825  APTT 31  INR 1.2  HGB 12.4  PLT 189    Date Time aPTT/HL Rate/Comment     PLAN: Give 3300 units bolus x1; then start heparin infusion at 750 units/hour. Check heparin level in 8 hours, then daily once at least two levels are consecutively therapeutic. Monitor CBC daily while on heparin infusion.   Will M. Dareen Piano, PharmD Clinical Pharmacist 09/08/2023 2:54 PM

## 2023-09-08 NOTE — ED Notes (Signed)
Report received from Lisa, RN

## 2023-09-08 NOTE — ED Notes (Signed)
 CCMD called and pt placed on cardiac monitoring.

## 2023-09-08 NOTE — Assessment & Plan Note (Signed)
 Patient was placed on furosemide 20 mg p.o. twice daily, resumed on admission for 09/09/2023

## 2023-09-09 DIAGNOSIS — I739 Peripheral vascular disease, unspecified: Principal | ICD-10-CM

## 2023-09-09 DIAGNOSIS — I70221 Atherosclerosis of native arteries of extremities with rest pain, right leg: Secondary | ICD-10-CM | POA: Diagnosis not present

## 2023-09-09 DIAGNOSIS — I998 Other disorder of circulatory system: Secondary | ICD-10-CM | POA: Diagnosis not present

## 2023-09-09 DIAGNOSIS — M79605 Pain in left leg: Secondary | ICD-10-CM

## 2023-09-09 DIAGNOSIS — M79604 Pain in right leg: Secondary | ICD-10-CM | POA: Diagnosis not present

## 2023-09-09 DIAGNOSIS — I482 Chronic atrial fibrillation, unspecified: Secondary | ICD-10-CM | POA: Diagnosis not present

## 2023-09-09 LAB — CBC
HCT: 34.6 % — ABNORMAL LOW (ref 36.0–46.0)
Hemoglobin: 11.2 g/dL — ABNORMAL LOW (ref 12.0–15.0)
MCH: 31.6 pg (ref 26.0–34.0)
MCHC: 32.4 g/dL (ref 30.0–36.0)
MCV: 97.7 fL (ref 80.0–100.0)
Platelets: 175 10*3/uL (ref 150–400)
RBC: 3.54 MIL/uL — ABNORMAL LOW (ref 3.87–5.11)
RDW: 15.8 % — ABNORMAL HIGH (ref 11.5–15.5)
WBC: 5.8 10*3/uL (ref 4.0–10.5)
nRBC: 0 % (ref 0.0–0.2)

## 2023-09-09 LAB — BASIC METABOLIC PANEL
Anion gap: 5 (ref 5–15)
BUN: 28 mg/dL — ABNORMAL HIGH (ref 8–23)
CO2: 21 mmol/L — ABNORMAL LOW (ref 22–32)
Calcium: 9 mg/dL (ref 8.9–10.3)
Chloride: 105 mmol/L (ref 98–111)
Creatinine, Ser: 1.18 mg/dL — ABNORMAL HIGH (ref 0.44–1.00)
GFR, Estimated: 43 mL/min — ABNORMAL LOW (ref >60)
Glucose, Bld: 103 mg/dL — ABNORMAL HIGH (ref 70–99)
Potassium: 4.5 mmol/L (ref 3.5–5.1)
Sodium: 131 mmol/L — ABNORMAL LOW (ref 135–145)

## 2023-09-09 LAB — HEPARIN LEVEL (UNFRACTIONATED): Heparin Unfractionated: >1.1 [IU]/mL — ABNORMAL HIGH (ref 0.30–0.70)

## 2023-09-09 MED ORDER — HEPARIN (PORCINE) 25000 UT/250ML-% IV SOLN
550.0000 [IU]/h | INTRAVENOUS | Status: DC
  Administered 2023-09-09: 550 [IU]/h via INTRAVENOUS

## 2023-09-09 NOTE — TOC Transition Note (Signed)
 Transition of Care [facility]) - Discharge Note   Patient Details  Name: Megan Rodgers MRN: [id number] Date of Birth: Nov 29, 1928  [facility] CM/SW Contact:  Margarito Liner, LCSW Phone Number: 09/09/2023, 3:26 PM   Clinical Narrative:   Patient has orders to discharge home today. [facility] liaison will follow up with daughter regarding admission date/time. Daughter is aware. RN will arrange Designer, fashion/clothing. No further concerns. CSW signing off.   Final next level of care: Home w Hospice Care Barriers to Discharge: Barriers Resolved   Patient Goals and CMS Choice     Choice offered to / list presented to : Adult Children      Discharge Placement                Patient to be transferred to facility by: [facility] Name of family member notified: Nolene Ebbs Patient and family notified of of transfer: 09/09/23  Discharge Plan and Services Additional resources added to the After Visit Summary for       Post Acute Care Choice: Resumption of Svcs/PTA Provider                               Social Drivers of Health (SDOH) Interventions SDOH Screenings   Food Insecurity: Low Risk  (05/21/2023)   Received from [facility]  Housing: Low Risk  (05/21/2023)   Received from [facility]  Transportation Needs: No Transportation Needs (05/21/2023)   Received from [facility]  Utilities: Low Risk  (05/21/2023)   Received from [facility]  Financial Resource Strain: Low Risk  (03/06/2022)   Received from [facility]  Physical Activity: Unknown (02/23/2019)   Received from [facility]  Social Connections: Unknown (02/23/2019)   Received from [facility]  Stress: Unknown (02/23/2019)   Received from [facility]  Tobacco Use: Low Risk  (09/08/2023)     Readmission Risk Interventions     No data to display

## 2023-09-09 NOTE — ED Notes (Signed)
 Pt's daughter, Gavin Pound, called and informed that [facility] was en route with pt.

## 2023-09-09 NOTE — ED Notes (Signed)
 Per pharmacy heparin paused at this time.

## 2023-09-09 NOTE — Discharge Summary (Signed)
 Physician Discharge Summary   Patient: Megan Rodgers MRN: [id number] DOB: [birth year]  Admit date:     09/08/2023  Discharge date: 09/09/23  Discharge Physician: Delfino Lovett   PCP: [facility]   Recommendations at discharge:   Home with hospice  Discharge Diagnoses: Principal Problem:   Acute lower limb ischemia Active Problems:   Essential hypertension   Protein-calorie malnutrition, severe   TIA (transient ischemic attack)   Hypothyroidism   Pulmonary hypertension (HCC)   Atrial fibrillation, chronic (HCC)   Dementia without behavioral disturbance (HCC)   Tremors of nervous system   Leg swelling   Peripheral artery disease (HCC)   Pain in both lower extremities  Hospital Course: Megan Rodgers is a 89 year old female with history of atrial fibrillation, hypertension, hypothyroid, pulmonary hypertension followed by hospice, who presents emergency department for chief concerns of right lower extremity pain and discoloration.  Vitals in the ED showed temperature of 97.7, respiration rate of 16, heart rate 77, blood pressure 125/81, SpO2 100% on room air.  Serum sodium is 139, potassium 5.0, chloride 110, bicarb 23, BUN of 28, serum creatinine 1.06, EGFR 49, nonfasting blood glucose 100, WBC 5.6, hemoglobin 12.4, platelets of 159.  BNP was elevated to 558.2.  High since he troponin was 57 and on repeat was 42.  ED treatment: Benadryl 50 mg IV one-time dose, Solu-Medrol 40 mg IV one-time dose, morphine 2 mg IV, heparin bolus and gtt.  EDP consulted vascular service, who recommends starting heparin with admission to hospitalist service.  Vascular service will consult.  Assessment and Plan: *Arterial insufficiency of the lower extremities Evaluated by vascular surgery.  She underwent CT angio suggestive of chronic atherosclerotic disease of her tibials and small vessel disease and unlikely acute ischemic situation per vascular surgery input No further  intervention per vascular surgery  Leg swelling Continue home Lasix  Tremors of nervous system Home primidone 50 mg QID resumed  Dementia without behavioral disturbance (HCC) Home donepezil 5 mg nightly resumed  Hypothyroidism Levothyroxine 75 mcg daily resumed on admission  Essential hypertension Continue home medications  Goals of care I had discussion with patient's daughter who agrees with the discharge plan and would like patient to come back home with hospice       Consultants: Vascular surgery  Disposition: Hospice care Diet recommendation:  Discharge Diet Orders (From admission, onward)     Start     Ordered   09/09/23 0000  Diet - low sodium heart healthy        09/09/23 1051           Carb modified diet DISCHARGE MEDICATION: Allergies as of 09/09/2023       Reactions   Penicillins Anaphylaxis   Has patient had a PCN reaction causing immediate rash, facial/tongue/throat swelling, SOB or lightheadedness with hypotension: Yes Has patient had a PCN reaction causing severe rash involving mucus membranes or skin necrosis: No Has patient had a PCN reaction that required hospitalization: No Has patient had a PCN reaction occurring within the last 10 years: Unknown If all of the above answers are "NO", then may proceed with Cephalosporin use.   Codeine    Iodinated Contrast Media    Metronidazole    Shellfish-derived Products    Tetracyclines & Related         Medication List     STOP taking these medications    dabigatran 150 MG Caps capsule Commonly known as: PRADAXA   estradiol 0.1 MG/GM vaginal  cream Commonly known as: ESTRACE       TAKE these medications    acetaminophen 500 MG tablet Commonly known as: TYLENOL Take 1,000 mg by mouth every 6 (six) hours as needed.   aspirin EC 325 MG tablet Take 1 tablet by mouth daily.   citalopram 10 MG tablet Commonly known as: CELEXA Take 5 mg by mouth daily.   diclofenac Sodium 1 %  Gel Commonly known as: VOLTAREN Apply 2 g topically 4 (four) times daily.   donepezil 5 MG tablet Commonly known as: ARICEPT Take 5 mg by mouth at bedtime.   fluticasone 50 MCG/ACT nasal spray Commonly known as: FLONASE Place into both nostrils daily.   furosemide 20 MG tablet Commonly known as: LASIX Take 20 mg by mouth daily.   LACTOBACILLUS PO Take by mouth.   levothyroxine 75 MCG tablet Commonly known as: SYNTHROID Take 75 mcg by mouth daily. Take on an empty stomach with a glass of water at least 30-60 minutes before breakfast   lidocaine 5 % Commonly known as: LIDODERM Place 1 patch onto the skin daily.   lidocaine 5 % ointment Commonly known as: XYLOCAINE Apply 1 Application topically daily as needed.   multivitamin capsule Take 1 capsule by mouth daily.   naphazoline-glycerin 0.012-0.2 % Soln Commonly known as: CLEAR EYES REDNESS Apply to eye.   primidone 50 MG tablet Commonly known as: MYSOLINE Take 50 mg by mouth 4 (four) times daily.   propranolol 60 MG tablet Commonly known as: INDERAL Take 60 mg by mouth daily.   triamcinolone 0.025 % cream Commonly known as: KENALOG Apply 1 application topically 2 (two) times daily.   Vitamin D-3 25 MCG (1000 UT) Caps Take 1 capsule (1,000 Units total) by mouth daily.        Follow-up Information     [place], [facility]. Schedule an appointment as soon as possible for a visit in 1 week(s).   Specialty: Family Medicine Why: [facility] Discharge F/UP Contact information: [address] [place] [address] [phone]                Discharge Exam: Megan Rodgers Weights   09/08/23 0817  Weight: 61.8 kg   Constitutional: appears age-appropriate, frail, NAD, calm Neck: normal, supple, no masses, no thyromegaly Respiratory: Clear to auscultation bilaterally Cardiovascular: Regular rate and rhythm, no murmurs Abdomen: Soft, benign Skin/extremities: She does have poor  signs of circulation in the bilateral lower extremities with no pulsation in the posterior tibial and dorsalis pedis.  Capillary refill is somewhat sluggish in both feet as well. Neurologic: Sensation intact. Strength 5/5 in all 4.    Condition at discharge: poor  The results of significant diagnostics from this hospitalization (including imaging, microbiology, ancillary and laboratory) are listed below for reference.   Imaging Studies: CT Angio Aortobifemoral W and/or Wo Contrast Result Date: 09/08/2023 CLINICAL DATA:  Atraumatic right lower extremity pain since this morning, purple discoloration right foot, decreased pulses EXAM: CT ANGIOGRAPHY OF ABDOMINAL AORTA WITH ILIOFEMORAL RUNOFF TECHNIQUE: Multidetector CT imaging of the abdomen, pelvis and lower extremities was performed using the standard protocol during bolus administration of intravenous contrast. Multiplanar CT image reconstructions and MIPs were obtained to evaluate the vascular anatomy. RADIATION DOSE REDUCTION: This exam was performed according to the departmental dose-optimization program which includes automated exposure control, adjustment of the mA and/or kV according to patient size and/or use of iterative reconstruction technique. CONTRAST:  OMNIPAQUE IOHEXOL 350 MG/ML SOLN COMPARISON:  None Available. FINDINGS: VASCULAR Aorta: Normal caliber aorta without aneurysm, dissection, vasculitis or significant stenosis. Extensive atherosclerosis. Celiac: Patent without evidence of aneurysm, dissection, vasculitis or significant stenosis. SMA: Patent without evidence of aneurysm, dissection, vasculitis or significant stenosis. Renals: There is severe atherosclerosis of the bilateral renal arteries, with high-grade ostial stenosis bilaterally, right greater than left, estimated 70-99%. No aneurysm, dissection, vasculitis, or fibromuscular dysplasia. IMA: Patent without evidence of aneurysm, dissection, vasculitis or significant  stenosis. RIGHT Lower Extremity Inflow: Common, internal and external iliac arteries are patent without evidence of aneurysm, dissection, vasculitis or significant stenosis. Extensive atherosclerosis. Outflow: The common femoral artery is widely patent. The profundus femoral and superficial femoral arteries are patent through the level of the mid thigh, with mild diffuse atheromatous plaque without focal stenosis. Beyond the level of the mid thigh, the SFA is not well visualized due to timing of contrast bolus. Delayed images do not include the unopacified portion of the distal right SFA, and stenosis in this region cannot be excluded. Delayed images beginning at the level the popliteal artery demonstrate wide patency without evidence of critical stenosis. Runoff: The anterior tibial artery and tibioperoneal trunk are widely patent. The anterior tibial artery and a diminutive peroneal artery are visualized to the level of the distal right lower leg. The posterior tibial artery does not opacify. LEFT Lower Extremity Inflow: The common iliac and external iliac arteries are patent. There is extensive atherosclerosis without focal stenosis. There is occlusion of the left internal iliac artery from its origin. No aneurysm, dissection, or vasculitis. Outflow: The left common femoral artery is widely patent. The profundus femoral and superficial femoral arteries are patent to the level of the midthigh. Beyond the mid thigh, the SFA is not well visualized due to timing of contrast bolus. Delayed images do not include the distal SFA and stenosis in this region cannot be excluded. Delayed images beginning at the level of popliteal artery demonstrate patency of the vessel without critical stenosis. Runoff: Anterior tibial artery is patent at its origin. The tibioperoneal trunk is patent at its origin, but the posterior tibial and peroneal arteries do not opacify. The anterior tibial artery is diminutive, as opacified to the  level of the proximal left lower leg. Veins: No obvious venous abnormality within the limitations of this arterial phase study. Review of the MIP images confirms the above findings. NON-VASCULAR Lower chest: The heart is enlarged, with marked biatrial dilatation right greater than left. Dual lead cardiac pacer is identified. Pericardial cyst versus localized pericardial effusion is seen along the right heart border, measuring up to 3.5 cm. Reflux of contrast into the hepatic veins and IVC suggest significant underlying cardiac dysfunction. Small right pleural effusion with minimal right lower lobe atelectasis. Hepatobiliary: Punctate calcified gallstones without cholecystitis. Liver is unremarkable. Pancreas: Unremarkable. No pancreatic ductal dilatation or surrounding inflammatory changes. Spleen: Normal in size without focal abnormality. Adrenals/Urinary Tract: There is heterogeneous decreased enhancement of the right renal parenchyma relative to the left. This is likely due to the high-grade renal artery stenosis described above in phase of contrast enhancement, though underlying pyelonephritis cannot be excluded. Please correlate with urinalysis. No urinary tract calculi or obstructive uropathy. The bladder is unremarkable. No adrenal masses. Stomach/Bowel: No bowel obstruction or ileus. No bowel wall thickening or inflammatory change. Lymphatic: No pathologic adenopathy. Reproductive: Status post hysterectomy. 1.7 cm simple appearing cyst within the right ovary, no specific follow-up recommended. Left ovary is atrophic. Other: No free fluid or free intraperitoneal gas. No abdominal  wall hernia. Musculoskeletal: No acute or destructive bony abnormalities. Reconstructed images demonstrate no additional findings. IMPRESSION: VASCULAR 1. Limited CTA runoff due to timing contrast bolus and slice selection. The mid to distal superficial femoral arteries are not adequately evaluated and stenosis in these regions  cannot be excluded. 2. Extensive small vessel disease within the trifurcation vessels as above, with none of the trifurcation vessels opacify beyond the level of the distal lower legs. 3. Extensive aortic atherosclerosis, with high-grade stenoses of the bilateral renal arteries right greater than left. 4. 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 NON-VASCULAR 1. Cardiomegaly, with marked biatrial dilatation. Extensive reflux of contrast into the hepatic veins and IVC consistent with severe cardiac dysfunction. 2. Localized pericardial effusion versus pericardial cyst along the right heart border, measuring up to 3.5 cm in thickness. 3. Small right pleural effusion. 4. Heterogeneous decreased enhancement of the right renal parenchyma relative to the left, felt to be due to the high-grade renal artery stenosis described above. However, underlying pyelonephritis could have a similar appearance and correlation with urinalysis is recommended. 5. Cholelithiasis without cholecystitis. Electronically Signed   By: Sharlet Salina M.D.   On: 09/08/2023 15:31    Microbiology: Results for orders placed or performed during the hospital encounter of 02/03/19  SARS CORONAVIRUS 2 Nasal Swab Aptima Multi Swab     Status: None   Collection Time: 02/03/19  1:49 PM   Specimen: Aptima Multi Swab; Nasal Swab  Result Value Ref Range Status   SARS Coronavirus 2 NEGATIVE NEGATIVE Final    Comment: (NOTE) SARS-CoV-2 target nucleic acids are NOT DETECTED. The SARS-CoV-2 RNA is generally detectable in upper and lower respiratory specimens during the acute phase of infection. Negative results do not preclude SARS-CoV-2 infection, do not rule out co-infections with other pathogens, and should not be used as the sole basis for treatment or other patient management decisions. Negative results must be combined with clinical observations, patient history, and epidemiological information. The expected result is  Negative. Fact Sheet for Patients: HairSlick.no Fact Sheet for Healthcare Providers: quierodirigir.com This test is not yet approved or cleared by the [place] FDA and  has been authorized for detection and/or diagnosis of SARS-CoV-2 by FDA under an Emergency Use Authorization (EUA). This EUA will remain  in effect (meaning this test can be used) for the duration of the COVID-19 declaration under Section 56 4(b)(1) of the Act, 21 U.S.C. section 360bbb-3(b)(1), unless the authorization is terminated or revoked sooner. Performed at [facility], [address] [place], [place] [address]   Urine Culture     Status: Abnormal   Collection Time: 02/03/19  3:57 PM   Specimen: Urine, Random  Result Value Ref Range Status   Specimen Description   Final    URINE, RANDOM Performed at [facility], [address], [place], [place] [address]    Special Requests   Final    NONE Performed at [facility], [address] [place], [place] [address]    Culture (A)  Final    >=100,000 COLONIES/mL KLEBSIELLA PNEUMONIAE 90,000 COLONIES/mL ESCHERICHIA COLI    Report Status 02/05/2019 FINAL  Final   Organism ID, Bacteria KLEBSIELLA PNEUMONIAE (A)  Final   Organism ID, Bacteria ESCHERICHIA COLI (A)  Final      Susceptibility   Escherichia coli - MIC*    AMPICILLIN <=2 SENSITIVE Sensitive     CEFAZOLIN <=4 SENSITIVE Sensitive     CEFTRIAXONE <=1 SENSITIVE Sensitive  CIPROFLOXACIN <=0.25 SENSITIVE Sensitive     GENTAMICIN <=1 SENSITIVE Sensitive     IMIPENEM <=0.25 SENSITIVE Sensitive     NITROFURANTOIN <=16 SENSITIVE Sensitive     TRIMETH/SULFA <=20 SENSITIVE Sensitive     AMPICILLIN/SULBACTAM <=2 SENSITIVE Sensitive     PIP/TAZO <=4 SENSITIVE Sensitive     Extended ESBL NEGATIVE Sensitive     * 90,000 COLONIES/mL ESCHERICHIA COLI   Klebsiella pneumoniae - MIC*    AMPICILLIN RESISTANT Resistant      CEFAZOLIN <=4 SENSITIVE Sensitive     CEFTRIAXONE <=1 SENSITIVE Sensitive     CIPROFLOXACIN <=0.25 SENSITIVE Sensitive     GENTAMICIN <=1 SENSITIVE Sensitive     IMIPENEM <=0.25 SENSITIVE Sensitive     NITROFURANTOIN <=16 SENSITIVE Sensitive     TRIMETH/SULFA <=20 SENSITIVE Sensitive     AMPICILLIN/SULBACTAM 4 SENSITIVE Sensitive     PIP/TAZO <=4 SENSITIVE Sensitive     Extended ESBL NEGATIVE Sensitive     * >=100,000 COLONIES/mL KLEBSIELLA PNEUMONIAE    Labs: CBC: Recent Labs  Lab 09/08/23 0825 09/09/23 0447  WBC 5.6 5.8  NEUTROABS 3.0  --   HGB 12.4 11.2*  HCT 38.5 34.6*  MCV 98.0 97.7  PLT 189 175   Basic Metabolic Panel: Recent Labs  Lab 09/08/23 0825 09/09/23 0447  NA 139 131*  K 5.0 4.5  CL 110 105  CO2 23 21*  GLUCOSE 100* 103*  BUN 28* 28*  CREATININE 1.06* 1.18*  CALCIUM 9.2 9.0   Liver Function Tests: Recent Labs  Lab 09/08/23 0825  AST 20  ALT 11  ALKPHOS 77  BILITOT 0.6  PROT 6.9  ALBUMIN 3.6   CBG: No results for input(s): "GLUCAP" in the last 168 hours.  Discharge time spent: greater than 30 minutes.  Signed: Delfino Lovett, MD [facility] 09/09/2023

## 2023-09-09 NOTE — ED Notes (Signed)
 Pt's daughter, Nolene Ebbs, called and informed that pt transport has been called.

## 2023-09-09 NOTE — Care Management Obs Status (Signed)
 MEDICARE OBSERVATION STATUS NOTIFICATION   Patient Details  Name: Megan Rodgers MRN: [id number] Date of Birth: 12-31-1928   Medicare Observation Status Notification Given:  Yes    Margarito Liner, LCSW 09/09/2023, 12:45 PM

## 2023-09-09 NOTE — Consult Note (Signed)
 Pharmacy Consult Note - Anticoagulation  Pharmacy Consult for heparin Indication:  PAD, acute ischemia  PATIENT MEASUREMENTS: Height: 5\' 5"  (165.1 cm) Weight: 61.8 kg (136 lb 4.8 oz) IBW/kg (Calculated) : 57 HEPARIN DW (KG): 61.8  VITAL SIGNS: Temp: 97.9 F (36.6 C) (03/17 2346) Temp Source: Oral (03/17 2346) BP: 123/68 (03/18 0100) Pulse Rate: 74 (03/18 0100)  Recent Labs    09/08/23 0825 09/08/23 1030 09/08/23 2358  HGB 12.4  --   --   HCT 38.5  --   --   PLT 189  --   --   APTT 31  --   --   LABPROT 15.1  --   --   INR 1.2  --   --   HEPARINUNFRC  --   --  >1.10*  CREATININE 1.06*  --   --   TROPONINIHS 57* 42*  --     Estimated Creatinine Clearance: 30.5 mL/min (A) (by C-G formula based on SCr of 1.06 mg/dL (H)).  PAST MEDICAL HISTORY: Past Medical History:  Diagnosis Date   Atrial fibrillation (HCC)    Cancer (HCC)    skin ca   Hypertension    Thyroid disease    Tremors of nervous system     ASSESSMENT: 89 y.o. female with PMH PAD is presenting with RLE pain / claudication. Toes to R foot have purple discoloration. Patient is not on chronic anticoagulation per chart review. Pharmacy has been consulted to initiate and manage heparin intravenous infusion.  Pertinent medications: No chronic anticoagulation PTA per chart review  Goal(s) of therapy: Heparin level 0.3 - 0.7 units/mL Monitor platelets by anticoagulation protocol: Yes   Baseline anticoagulation labs: Recent Labs    09/08/23 0825  APTT 31  INR 1.2  HGB 12.4  PLT 189    Date Time aPTT/HL Rate/Comment 3/17 2358 HL > 1.1 Supratherapeutic   PLAN: Insurance claims handler.  RN held infusion x 15 min prior to drawing labs.  Hold infusion for 1 hour. Restart heparin infusion at 550 units/hour. Check heparin level in 8 hours, then daily once at least two levels are consecutively therapeutic. Monitor CBC daily while on heparin infusion.  Otelia Sergeant, PharmD, MBA 09/09/2023 1:40 AM

## 2023-09-09 NOTE — Care Management CC44 (Signed)
 Condition Code 44 Documentation Completed  Patient Details  Name: TAWNIA SCHIRM MRN: [id number] Date of Birth: 23-Nov-1928   Condition Code 44 given:  Yes Patient signature on Condition Code 44 notice:  Yes Documentation of 2 MD's agreement:  Yes Code 44 added to claim:  Yes    Margarito Liner, LCSW 09/09/2023, 12:46 PM

## 2023-09-09 NOTE — TOC Initial Note (Addendum)
 Transition of Care [facility]) - Initial/Assessment Note    Patient Details  Name: Megan Rodgers MRN: [id number] Date of Birth: 12/16/1928  Transition [facility] CM/SW Contact:    Margarito Liner, LCSW Phone Number: 09/09/2023, 12:48 PM  Clinical Narrative:  Per MD, patient will discharge home today and resume home hospice services through [facility]. Daughter Gavin Pound confirmed. She revoked services yesterday due to hospitalization so, per hospice social worker, she will require a new referral. CSW faxed referral information to their intake department and noted on cover sheet to call CSW once reviewed. Patient will require EMS transport. Address on facesheet is complete. Patient lives with her son and has caregivers.                 2:00 pm: [facility] intake liaison is checking status of referral.  Expected Discharge Plan: Home w Hospice Care Barriers to Discharge: Other (must enter comment) (Hospice confirmation)   Patient Goals and CMS Choice     Choice offered to / list presented to : Adult Children      Expected Discharge Plan and Services     Post Acute Care Choice: Resumption of Svcs/PTA Provider Living arrangements for the past 2 months: Single Family Home Expected Discharge Date: 09/09/23                                    Prior Living Arrangements/Services Living arrangements for the past 2 months: Single Family Home Lives with:: Adult Children Patient language and need for interpreter reviewed:: Yes Do you feel safe going back to the place where you live?: Yes      Need for Family Participation in Patient Care: Yes (Comment) Care giver support system in place?: Yes (comment) Current home services: Hospice Criminal Activity/Legal Involvement Pertinent to Current Situation/Hospitalization: No - Comment as needed  Activities of Daily Living      Permission Sought/Granted Permission sought to share information with : Family Supports, Passenger transport manager granted to share information with : Yes, Verbal Permission Granted  Share Information with NAME: Nolene Ebbs  Permission granted to share info w AGENCY: [facility]  Permission granted to share info w Relationship: Daughter  Permission granted to share info w Contact Information: [phone]  Emotional Assessment Appearance:: Appears stated age Attitude/Demeanor/Rapport: Engaged Affect (typically observed): Accepting, Appropriate, Calm, Pleasant Orientation: : Oriented to Self, Oriented to Place, Oriented to  Time, Oriented to Situation Alcohol / Substance Use: Not Applicable Psych Involvement: No (comment)  Admission diagnosis:  Acute lower limb ischemia [I99.8] Patient Active Problem List   Diagnosis Date Noted   Peripheral artery disease (HCC) 09/09/2023   Pain in both lower extremities 09/09/2023   Acute lower limb ischemia 09/08/2023   Hypothyroidism 09/08/2023   Pulmonary hypertension (HCC) 09/08/2023   Atrial fibrillation, chronic (HCC) 09/08/2023   Dementia without behavioral disturbance (HCC) 09/08/2023   Tremors of nervous system 09/08/2023   Leg swelling 09/08/2023   TIA (transient ischemic attack) 02/03/2019   Protein-calorie malnutrition, severe 04/08/2018   Hyponatremia 04/07/2018   Fall at home, initial encounter 04/07/2018   Pain in joint involving ankle and foot 08/20/2017   Essential hypertension 08/20/2017   PCP:  [facility] Pharm[facility]:   [facility] [address], [place] - [address] [place] [address] Phone: [phone] Fax: [phone]     Social Drivers of Health (  SDOH) Social History: SDOH Screenings   Food Insecurity: Low Risk  (05/21/2023)   Received from [facility]  Housing: Low Risk  (05/21/2023)   Received from [facility]  Transportation Needs: No Transportation Needs (05/21/2023)   Received from [facility]  Utilities: Low Risk  (05/21/2023)    Received from [facility]  Financial Resource Strain: Low Risk  (03/06/2022)   Received from [facility]  Physical Activity: Unknown (02/23/2019)   Received from [facility]  Social Connections: Unknown (02/23/2019)   Received from [facility]  Stress: Unknown (02/23/2019)   Received from [facility]  Tobacco Use: Low Risk  (09/08/2023)   SDOH Interventions:     Readmission Risk Interventions     No data to display

## 2023-09-30 ENCOUNTER — Emergency Department

## 2023-09-30 ENCOUNTER — Encounter: Payer: Self-pay | Admitting: Emergency Medicine

## 2023-09-30 ENCOUNTER — Other Ambulatory Visit: Payer: Self-pay

## 2023-09-30 ENCOUNTER — Emergency Department
Admission: EM | Admit: 2023-09-30 | Discharge: 2023-09-30 | Disposition: A | Discharge status: Home / Self Care | Attending: Emergency Medicine | Admitting: Emergency Medicine

## 2023-09-30 DIAGNOSIS — I10 Essential (primary) hypertension: Secondary | ICD-10-CM | POA: Insufficient documentation

## 2023-09-30 DIAGNOSIS — M5489 Other dorsalgia: Secondary | ICD-10-CM

## 2023-09-30 DIAGNOSIS — W19XXXA Unspecified fall, initial encounter: Secondary | ICD-10-CM | POA: Insufficient documentation

## 2023-09-30 DIAGNOSIS — R0781 Pleurodynia: Secondary | ICD-10-CM | POA: Diagnosis not present

## 2023-09-30 DIAGNOSIS — S22000A Wedge compression fracture of unspecified thoracic vertebra, initial encounter for closed fracture: Secondary | ICD-10-CM

## 2023-09-30 DIAGNOSIS — S32020A Wedge compression fracture of second lumbar vertebra, initial encounter for closed fracture: Secondary | ICD-10-CM

## 2023-09-30 DIAGNOSIS — M79645 Pain in left finger(s): Secondary | ICD-10-CM | POA: Diagnosis not present

## 2023-09-30 DIAGNOSIS — F0390 Unspecified dementia without behavioral disturbance: Secondary | ICD-10-CM | POA: Insufficient documentation

## 2023-09-30 DIAGNOSIS — M546 Pain in thoracic spine: Secondary | ICD-10-CM | POA: Insufficient documentation

## 2023-09-30 DIAGNOSIS — S22080A Wedge compression fracture of T11-T12 vertebra, initial encounter for closed fracture: Secondary | ICD-10-CM

## 2023-09-30 MED ORDER — ASPIRIN 325 MG PO TABS
325.0000 mg | ORAL_TABLET | Freq: Once | ORAL | Status: AC
  Administered 2023-09-30: 325 mg via ORAL
  Filled 2023-09-30: qty 1

## 2023-09-30 MED ORDER — ACETAMINOPHEN 500 MG PO TABS
1000.0000 mg | ORAL_TABLET | Freq: Once | ORAL | Status: AC
  Administered 2023-09-30: 1000 mg via ORAL
  Filled 2023-09-30: qty 2

## 2023-09-30 MED ORDER — ACETAMINOPHEN 325 MG PO TABS
650.0000 mg | ORAL_TABLET | Freq: Once | ORAL | Status: DC
  Filled 2023-09-30: qty 2

## 2023-09-30 NOTE — ED Triage Notes (Signed)
 Patient to ED via [facility] from home after a fall. Pt reports she was reaching for her walker when she fell. C/o left rib pain and left thumb pain. Denies LOC but does take aspirin. Currently on hospice.

## 2023-09-30 NOTE — ED Notes (Signed)
 L wrist brace applied at this time. Pt states it feels good and denies any complaints.

## 2023-09-30 NOTE — ED Provider Notes (Signed)
 Physical Exam  BP (!) 156/68   Pulse 72   Temp 98 F (36.7 C) (Oral)   Resp 18   Ht 5\' 5"  (1.651 m)   Wt 61 kg   SpO2 97%   BMI 22.38 kg/m   Physical Exam Constitutional:      Appearance: Normal appearance. She is well-developed.     Comments: Patient ambulatory in the room with a walker.  Able to ambulate from bed around the room to the chair x 2.  Complaining of some lower back pain but son states this is baseline.  HENT:     Head: Normocephalic and atraumatic.  Eyes:     Conjunctiva/sclera: Conjunctivae normal.  Cardiovascular:     Rate and Rhythm: Normal rate.  Pulmonary:     Effort: Pulmonary effort is normal. No respiratory distress.  Musculoskeletal:        General: Normal range of motion.     Cervical back: Normal range of motion.     Comments: No tenderness throughout the left ankle.  Skin:    General: Skin is warm.     Findings: No rash.  Neurological:     Mental Status: She is alert and oriented to person, place, and time. Mental status is at baseline.     Motor: No weakness.     Gait: Gait normal.  Psychiatric:        Behavior: Behavior normal.        Thought Content: Thought content normal.     Procedures  Procedures  ED Course / MDM   Clinical Course as of 09/30/23 2036  Tue Sep 30, 2023  1619 Discussed with daughter hx of compression fracture of spine.  Caregiver and son now in the room. On exam spine is tender in the lower thoracic and lumbar region. Per caregiver this is more severe than her norm.  Daughter reports needing daily aspirin 325 mg.  Will order. [MH]  1628 DG Ankle Complete Left [MH]  1629 Possible nondisplaced ankle fracture of the talus bone.  Given clinical tenderness with palpation and patient normally ambulates with walker will obtain CT. [MH]    Clinical Course User Index [MH] Conrad , PA-C   Medical Decision Making Amount and/or Complexity of Data Reviewed Radiology: ordered. Decision-making details documented in  ED Course.  Risk OTC drugs.   Narrative & Impression  CLINICAL DATA:  Trauma, fall, left-sided rib pain.   EXAM: CT THORACIC AND LUMBAR SPINE WITHOUT CONTRAST   TECHNIQUE: Multidetector CT imaging of the thoracic and lumbar spine was performed without contrast. Multiplanar CT image reconstructions were also generated.   RADIATION DOSE REDUCTION: This exam was performed according to the departmental dose-optimization program which includes automated exposure control, adjustment of the mA and/or kV according to patient size and/or use of iterative reconstruction technique.   COMPARISON:  CT lumbar spine 07/13/2018   FINDINGS: CT THORACIC SPINE FINDINGS   Alignment: Thoracic kyphosis is maintained. No listhesis. Dextrocurvature of the thoracic spine centered at T9-10.   Vertebrae: Irregularity of the T3 through T6 superior endplates with mild anterior height loss. Schmorl's nodes at multiple levels in the mid and lower thoracic spine. Additional anterior wedging of the T12 vertebral body with approximately 40% height loss anteriorly. No suspicious osseous lesion.   Paraspinal and other soft tissues: The visualized paraspinal soft tissues are unremarkable. Cardiomegaly. There is likely significant right atrial enlargement. Significant atherosclerosis of the thoracic aorta. Small right pleural effusion. Partially visualized pacemaker leads in the  right atrium and right ventricle. Coronary artery atherosclerosis.   Disc levels: Mild intervertebral disc space narrowing at multiple levels. Degenerative endplate changes. No CT evidence of large disc herniation. No high-grade osseous spinal canal stenosis. Small disc bulge at T8-9 without significant stenosis. Disc bulge and posterior osteophytes at T11-12 resulting in mild spinal canal stenosis. There is also likely component of retropulsion at T12 contributing to spinal canal narrowing. No high-grade osseous foraminal  stenosis.   CT LUMBAR SPINE FINDINGS   Segmentation: Numbering of the spine is based on the superior most rib-bearing level which is designated as T1. There are 12 rib-bearing thoracic type vertebra. There are hypoplastic ribs noted bilaterally at L1. Based on this numbering there is transitional anatomy at the lumbosacral junction with a lumbarized S1 vertebral body. The lowest well-formed disc space is designated as S1-2.   Alignment: Lumbar lordosis is maintained. Trace retrolisthesis of L2 on L3 and of L3 on L4. Levocurvature of the lumbar spine centered at L3-4.   Vertebrae: Anterior wedging of the L1 vertebral body with approximately 30% height loss anteriorly. This level is incompletely visualized on the prior lumbar spine CT but the fracture and superior endplate irregularity was likely present at that time. Additional irregularity of the L2 superior endplate with anterior height loss of up to 45% which appears increased since 2020. Additional Schmorl's nodes and chronic endplate irregularity. Mild height loss of L3 is unchanged from prior. No suspicious osseous lesions.   Paraspinal and other soft tissues: The visualized paraspinal soft tissues are unremarkable. Extensive atherosclerosis of the abdominal aorta and branch vessels. Small hiatal hernia.   Disc levels: Disc space narrowing at multiple levels most pronounced on the right aspect of L3-4. Disc bulges at multiple levels. Disc bulge and trace retrolisthesis at L2-3 contributing to mild spinal canal stenosis. Disc bulge and facet arthrosis at L3-4 contributing to mild spinal canal stenosis. Additional disc bulge and facet arthrosis at L4-5 and L5-S1 resulting in mild-to-moderate spinal canal stenosis. There is severe foraminal narrowing on the right at L2-3.   IMPRESSION: CT THORACIC SPINE IMPRESSION   Irregularity of the T3 through T6 superior endplates with mild height loss anteriorly concerning for  compression fractures, age indeterminate. Consider MRI for further evaluation. No significant retropulsion.   Additional age indeterminate irregularity of the T12 vertebral body with 40% height loss anteriorly and subtle retropulsion.   Degenerative changes as above.   Small right pleural effusion.   CT LUMBAR SPINE IMPRESSION   Irregularity of the L1 superior endplate with approximately 30% height loss anteriorly, similar to prior. Irregularity of the L2 superior endplate with slightly increased height loss compared to prior, age indeterminate.   Degenerative changes of the lumbar spine as above. Severe foraminal narrowing on the right at L2-3 again noted.     Electronically Signed   By: Emily Filbert M.D.   On: 09/30/2023 19:38     EXAM: CT OF THE LEFT ANKLE WITHOUT CONTRAST   TECHNIQUE: Multidetector CT imaging of the left ankle was performed according to the standard protocol. Multiplanar CT image reconstructions were also generated.   RADIATION DOSE REDUCTION: This exam was performed according to the departmental dose-optimization program which includes automated exposure control, adjustment of the mA and/or kV according to patient size and/or use of iterative reconstruction technique.   COMPARISON:  X-ray left ankle 09/30/2023   FINDINGS: Bones/Joint/Cartilage   Diffusely heterogeneous density of the bones suggestive of diffusely decreased bone density. No cortical erosion or destruction.  Vague cortical irregularity along the talar neck suggestive an old healed fracture. No associated fracture to correlate with the radiographic finding. No evidence of fracture, dislocation, or joint effusion. No evidence of severe arthropathy. No aggressive appearing focal bone abnormality.   Ligaments   Suboptimally assessed by CT.   Muscles and Tendons   Grossly unremarkable.   Soft tissues   Mild subcutaneus soft tissue edema.   IMPRESSION: 1. No acute  displaced fracture or dislocation. 2. Diffusely decreased bone density.     Electronically Signed   By: Tish Frederickson M.D.   On: 09/30/2023 19:98        89 year old female with fall, CT scan thoracic and lumbar spine shows some evidence of mild new compression fractures from previous CT scan imaging performed in 2020.  Most fractures are age-indeterminate but some, lumbar spine fractures appear to be slightly increased when compared to previous imaging.  Patient's pain appears to be at baseline based on signs description.  Patient is able to ambulate in the room.  She has no radicular symptoms.  No neurological deficits.  CT of the ankle negative for fracture.  Will discharge home with Tylenol and have patient follow-up with orthopedics.  Patient and son feel comfortable with discharge to home as she has care at home and they typically are with her at all times especially with ambulating with a walker.       Ronnette Juniper 09/30/23 2039    Sharman Cheek, MD 09/30/23 [phone]

## 2023-09-30 NOTE — ED Notes (Signed)
 Pt to scans and back, family at bedside.

## 2023-09-30 NOTE — ED Notes (Signed)
Pt in scans. °

## 2023-09-30 NOTE — ED Provider Notes (Signed)
 [facility] Emergency Department Provider Note     Event Date/Time   First MD Initiated Contact with Patient 09/30/23 1226     (approximate)   History   Fall   HPI  Megan Rodgers is a 89 y.o. female with a history of dementia HTN, A-fib and thyroid disease presents to the ED following a fall.  She reports she was reaching for her walker when she fell onto her left side.  She is complaining of left rib pain and left thumb pain.  She cannot recall if she hit her head but she reports she did not lose consciousness.     Physical Exam   Triage Vital Signs: ED Triage Vitals  Encounter Vitals Group     BP 09/30/23 1157 117/71     Systolic BP Percentile --      Diastolic BP Percentile --      Pulse Rate 09/30/23 1333 76     Resp 09/30/23 1157 18     Temp 09/30/23 1214 98 F (36.7 C)     Temp Source 09/30/23 1157 Oral     SpO2 09/30/23 1333 97 %     Weight 09/30/23 1200 134 lb 7.7 oz (61 kg)     Height 09/30/23 1200 5\' 5"  (1.651 m)     Head Circumference --      Peak Flow --      Pain Score 09/30/23 1200 7     Pain Loc --      Pain Education --      Exclude from Growth Chart --     Most recent vital signs: Vitals:   09/30/23 1333 09/30/23 1842  BP:  (!) 161/66  Pulse: 76 72  Resp:  20  Temp:    SpO2: 97% 97%    General: Well appearing. Alert and oriented. INAD.   Head:  NCAT.  Eyes:  PERRLA. EOMI.  Ears:  No postauricular ecchymosis.  Nose:   Mucosa is moist. No rhinorrhea. Neck:   No cervical spine tenderness to palpation. Full ROM without difficulty.  CV:  Good peripheral perfusion. RRR.  RESP:  Normal effort. LCTAB. No retractions.  ABD:  No distention. Soft, Non tender.  BACK:  Moderate tenderness over midline lower thoracic region and lumbar region with palpation.   MSK:   Full ROM in all joints. No swelling, deformity or tenderness.   Moderate ecchymosis to left thumb.  Full range of motion and neurovascularly intact all  throughout.  NEURO: Cranial nerves II-XII intact. No focal deficits. Sensation and motor function intact. 4/5 muscle strength of UE & LE.    ED Results / Procedures / Treatments   Labs (all labs ordered are listed, but only abnormal results are displayed) Labs Reviewed - No data to display  RADIOLOGY  I personally viewed and evaluated these images as part of my medical decision making, as well as reviewing the written report by the radiologist.   CT Head Wo Contrast Result Date: 09/30/2023 CLINICAL DATA:  fall.  No loss of consciousness. EXAM: CT HEAD WITHOUT CONTRAST CT CERVICAL SPINE WITHOUT CONTRAST TECHNIQUE: Multidetector CT imaging of the head and cervical spine was performed following the standard protocol without intravenous contrast. Multiplanar CT image reconstructions of the cervical spine were also generated. RADIATION DOSE REDUCTION: This exam was performed according to the departmental dose-optimization program which includes automated exposure control, adjustment of the mA and/or kV according to patient size and/or use of iterative reconstruction technique.  COMPARISON:  CT scan head from 02/03/2019. FINDINGS: CT HEAD FINDINGS Brain: No evidence of acute infarction, hemorrhage, hydrocephalus, extra-axial collection or mass lesion/mass effect. There is bilateral periventricular hypodensity, which is non-specific but most likely seen in the settings of microvascular ischemic changes. Moderate in extent. Otherwise normal appearance of brain parenchyma. Ventricles are normal. Cerebral volume is age appropriate. Vascular: No hyperdense vessel or unexpected calcification. Intracranial arteriosclerosis. Skull: Normal. Negative for fracture or focal lesion. Sinuses/Orbits: No acute finding. Other: Visualized mastoid air cells are unremarkable. No mastoid effusion. CT CERVICAL SPINE FINDINGS Alignment: There is reversal of cervical lordosis. There is grade 1 anterolisthesis of C4 over C5, most  likely degenerative. This examination does not assess for ligamentous injury or stability. Skull base and vertebrae: No acute fracture. No primary bone lesion or focal pathologic process. Soft tissues and spinal canal: No prevertebral fluid or swelling. No visible canal hematoma. Disc levels: Markedly reduced C5-C6 intervertebral disc height with partial solid fusion. Mild-to-moderately reduced C6-7 intervertebral disc height. Remaining intervertebral disc heights show minimal loss of height. Mild multilevel facet arthropathy and marginal osteophyte formation. Upper chest: Negative. IMPRESSION: *No acute intracranial abnormality. *No acute osseous injury of the cervical spine. Electronically Signed   By: Jules Schick M.D.   On: 09/30/2023 15:45   CT Cervical Spine Wo Contrast Result Date: 09/30/2023 CLINICAL DATA:  fall.  No loss of consciousness. EXAM: CT HEAD WITHOUT CONTRAST CT CERVICAL SPINE WITHOUT CONTRAST TECHNIQUE: Multidetector CT imaging of the head and cervical spine was performed following the standard protocol without intravenous contrast. Multiplanar CT image reconstructions of the cervical spine were also generated. RADIATION DOSE REDUCTION: This exam was performed according to the departmental dose-optimization program which includes automated exposure control, adjustment of the mA and/or kV according to patient size and/or use of iterative reconstruction technique. COMPARISON:  CT scan head from 02/03/2019. FINDINGS: CT HEAD FINDINGS Brain: No evidence of acute infarction, hemorrhage, hydrocephalus, extra-axial collection or mass lesion/mass effect. There is bilateral periventricular hypodensity, which is non-specific but most likely seen in the settings of microvascular ischemic changes. Moderate in extent. Otherwise normal appearance of brain parenchyma. Ventricles are normal. Cerebral volume is age appropriate. Vascular: No hyperdense vessel or unexpected calcification. Intracranial  arteriosclerosis. Skull: Normal. Negative for fracture or focal lesion. Sinuses/Orbits: No acute finding. Other: Visualized mastoid air cells are unremarkable. No mastoid effusion. CT CERVICAL SPINE FINDINGS Alignment: There is reversal of cervical lordosis. There is grade 1 anterolisthesis of C4 over C5, most likely degenerative. This examination does not assess for ligamentous injury or stability. Skull base and vertebrae: No acute fracture. No primary bone lesion or focal pathologic process. Soft tissues and spinal canal: No prevertebral fluid or swelling. No visible canal hematoma. Disc levels: Markedly reduced C5-C6 intervertebral disc height with partial solid fusion. Mild-to-moderately reduced C6-7 intervertebral disc height. Remaining intervertebral disc heights show minimal loss of height. Mild multilevel facet arthropathy and marginal osteophyte formation. Upper chest: Negative. IMPRESSION: *No acute intracranial abnormality. *No acute osseous injury of the cervical spine. Electronically Signed   By: Jules Schick M.D.   On: 09/30/2023 15:45   DG Ankle Complete Left Result Date: 09/30/2023 CLINICAL DATA:  Fall.  Ankle pain. EXAM: LEFT ANKLE COMPLETE - 3+ VIEW COMPARISON:  None Available. FINDINGS: There is diffuse osteopenia of the visualized osseous structures. There is incomplete linear lucency in the posterior aspect of the talus which may represent subtle undisplaced fracture versus artifact. Correlate clinically to determine the need for additional imaging with  CT scan. Otherwise, no acute fracture or dislocation. No aggressive osseous lesion. Ankle mortise appears intact. No focal soft tissue swelling. No radiopaque foreign bodies. IMPRESSION: *There is incomplete linear lucency in the posterior aspect of the talus which may represent subtle undisplaced fracture versus artifact. Correlate clinically to determine the need for additional imaging with CT scan. Electronically Signed   By: Jules Schick M.D.   On: 09/30/2023 15:38   DG Knee Complete 4 Views Left Result Date: 09/30/2023 CLINICAL DATA:  Fall. EXAM: LEFT KNEE - COMPLETE 4+ VIEW COMPARISON:  04/08/2018. FINDINGS: There is diffuse osteopenia of the visualized osseous structures. No acute fracture or dislocation. No aggressive osseous lesion. There are degenerative changes of the knee joint in the form of mildly reduced tibio-femoral compartment joint space, tibial spiking and tricompartmental osteophytosis. Note is made of meniscal chondrocalcinosis. No knee effusion or focal soft tissue swelling. No radiopaque foreign bodies. IMPRESSION: No acute osseous abnormality of the left knee joint. Mild degenerative changes, as described above. Electronically Signed   By: Jules Schick M.D.   On: 09/30/2023 15:34   DG Finger Thumb Left Result Date: 09/30/2023 CLINICAL DATA:  Pain after fall EXAM: LEFT THUMB 3V COMPARISON:  None Available. FINDINGS: Osteopenia.  No fracture or dislocation.  Preserved joint spaces. IMPRESSION: No acute osseous abnormality.  Osteopenia. Electronically Signed   By: Karen Kays M.D.   On: 09/30/2023 14:41   DG Ribs Unilateral W/Chest Left Result Date: 09/30/2023 CLINICAL DATA:  Status post fall with left rib pain EXAM: LEFT RIBS AND CHEST - 4 VIEW COMPARISON:  Chest radiograph dated 04/07/2018 FINDINGS: No fracture or other bone lesions are seen involving the ribs. Blunting of the right costophrenic angle. No pneumothorax. Both lungs are clear. Enlarged cardiomediastinal silhouette. Left chest wall pacemaker leads project over the right atrium and ventricle. IMPRESSION: 1. No displaced rib fractures. 2. Blunting of the right costophrenic angle, which may represent a small pleural effusion. 3. Cardiomegaly. Electronically Signed   By: Agustin Cree M.D.   On: 09/30/2023 14:39    PROCEDURES:  Critical Care performed: No  Procedures   MEDICATIONS ORDERED IN ED: Medications  aspirin tablet 325 mg (325 mg Oral  Given 09/30/23 1700)     IMPRESSION / MDM / ASSESSMENT AND PLAN / ED COURSE  I reviewed the triage vital signs and the nursing notes.                              Clinical Course as of 09/30/23 1932  Tue Sep 30, 2023  1619 Discussed with daughter hx of compression fracture of spine.  Caregiver and son now in the room. On exam spine is tender in the lower thoracic and lumbar region. Per caregiver this is more severe than her norm.  Daughter reports needing daily aspirin 325 mg.  Will order. [MH]  1628 DG Ankle Complete Left [MH]  1629 Possible nondisplaced ankle fracture of the talus bone.  Given clinical tenderness with palpation and patient normally ambulates with walker will obtain CT. [MH]    Clinical Course User Index [MH] Kern Reap A, PA-C    89 y.o. female presents to the emergency department for evaluation and treatment of fall. See HPI for further details.   Differential diagnosis includes, but is not limited to fracture, ICH, dislocation, contusion  Patient's presentation is most consistent with acute complicated illness / injury requiring diagnostic workup.  Patient is alert and  oriented.  She is hemodynamically stable. Given clinical presentation of left thumb ecchoymosis and tenderness upon palpation, will place patient in wrist brace with thumb ABD for possible sprain.     Transferring care to [place], PA-C awaiting CT of the ankle, lumbar and thoracic spine. Other images obtained are reassuring. I presume if imaging is normal patient is stable for discharge home and outpatient follow-up with her PCP.    FINAL CLINICAL IMPRESSION(S) / ED DIAGNOSES   Final diagnoses:  Fall, initial encounter  Thumb pain, left  Midline back pain, unspecified back location, unspecified chronicity   Rx / DC Orders   ED Discharge Orders     None       Note:  This document was prepared using Dragon voice recognition software and may include unintentional dictation errors.     Romeo Apple, Saesha Llerenas A, PA-C 09/30/23 1932    Chesley Noon, MD 10/01/23 1504

## 2023-09-30 NOTE — ED Notes (Signed)
Pulled pt up in bed. 

## 2023-09-30 NOTE — ED Notes (Signed)
 Unable to get O2 sat due to cold extremities after multiple attempts. Warm blanket placed around patient's hands.

## 2023-09-30 NOTE — Discharge Instructions (Addendum)
 Please take Tylenol as needed for pain.  You may use a heating pad to the lower back.  Follow-up with orthopedics in 1 week for recheck.  Return to the ER for any worsening symptoms or any urgent changes in your health.  Please use a walker for ambulation.
